# Patient Record
Sex: Female | Born: 1970
Health system: Southern US, Community
[De-identification: ages and names within clinical notes are randomized; demographics above are authoritative.]

## PROBLEM LIST (undated history)

## (undated) DIAGNOSIS — M1990 Unspecified osteoarthritis, unspecified site: Secondary | ICD-10-CM

## (undated) DIAGNOSIS — G801 Spastic diplegic cerebral palsy: Secondary | ICD-10-CM

## (undated) DIAGNOSIS — G809 Cerebral palsy, unspecified: Secondary | ICD-10-CM

## (undated) DIAGNOSIS — T7840XA Allergy, unspecified, initial encounter: Secondary | ICD-10-CM

## (undated) DIAGNOSIS — Z87898 Personal history of other specified conditions: Secondary | ICD-10-CM

## (undated) HISTORY — DX: Cerebral palsy, unspecified: G80.9

## (undated) HISTORY — DX: Spastic diplegic cerebral palsy: G80.1

## (undated) HISTORY — PX: OTHER SURGICAL HISTORY: SHX169

## (undated) HISTORY — PX: CATARACT EXTRACTION: SUR2

## (undated) HISTORY — DX: Allergy, unspecified, initial encounter: T78.40XA

## (undated) HISTORY — DX: Unspecified osteoarthritis, unspecified site: M19.90

## (undated) HISTORY — DX: Personal history of other specified conditions: Z87.898

---

## 1998-05-19 ENCOUNTER — Other Ambulatory Visit: Admission: RE | Admit: 1998-05-19 | Discharge: 1998-05-19 | Payer: Self-pay | Admitting: Obstetrics and Gynecology

## 1999-06-08 ENCOUNTER — Other Ambulatory Visit: Admission: RE | Admit: 1999-06-08 | Discharge: 1999-06-08 | Payer: Self-pay | Admitting: Obstetrics and Gynecology

## 2000-07-31 ENCOUNTER — Other Ambulatory Visit: Admission: RE | Admit: 2000-07-31 | Discharge: 2000-07-31 | Payer: Self-pay | Admitting: Gynecology

## 2001-09-21 ENCOUNTER — Other Ambulatory Visit: Admission: RE | Admit: 2001-09-21 | Discharge: 2001-09-21 | Payer: Self-pay | Admitting: *Deleted

## 2002-04-15 ENCOUNTER — Encounter: Admission: RE | Admit: 2002-04-15 | Discharge: 2002-04-15 | Payer: Self-pay | Admitting: Gynecology

## 2002-11-14 ENCOUNTER — Other Ambulatory Visit: Admission: RE | Admit: 2002-11-14 | Discharge: 2002-11-14 | Payer: Self-pay | Admitting: Gynecology

## 2003-11-17 ENCOUNTER — Other Ambulatory Visit: Admission: RE | Admit: 2003-11-17 | Discharge: 2003-11-17 | Payer: Self-pay | Admitting: Gynecology

## 2004-04-21 ENCOUNTER — Other Ambulatory Visit: Admission: RE | Admit: 2004-04-21 | Discharge: 2004-04-21 | Payer: Self-pay | Admitting: Gynecology

## 2004-08-04 ENCOUNTER — Encounter: Admission: RE | Admit: 2004-08-04 | Discharge: 2004-11-02 | Payer: Self-pay | Admitting: Gynecology

## 2004-12-28 ENCOUNTER — Encounter: Admission: RE | Admit: 2004-12-28 | Discharge: 2005-03-28 | Payer: Self-pay | Admitting: Family Medicine

## 2005-06-29 ENCOUNTER — Other Ambulatory Visit: Admission: RE | Admit: 2005-06-29 | Discharge: 2005-06-29 | Payer: Self-pay | Admitting: Gynecology

## 2006-07-31 ENCOUNTER — Other Ambulatory Visit: Admission: RE | Admit: 2006-07-31 | Discharge: 2006-07-31 | Payer: Self-pay | Admitting: Obstetrics and Gynecology

## 2008-04-14 ENCOUNTER — Encounter: Admission: RE | Admit: 2008-04-14 | Discharge: 2008-07-13 | Payer: Self-pay | Admitting: Family Medicine

## 2008-11-13 ENCOUNTER — Ambulatory Visit: Payer: Self-pay | Admitting: Women's Health

## 2009-03-31 ENCOUNTER — Ambulatory Visit: Payer: Self-pay | Admitting: Women's Health

## 2009-06-26 ENCOUNTER — Ambulatory Visit: Payer: Self-pay | Admitting: Women's Health

## 2009-09-23 ENCOUNTER — Ambulatory Visit: Payer: Self-pay | Admitting: Women's Health

## 2009-12-01 ENCOUNTER — Encounter: Admission: RE | Admit: 2009-12-01 | Discharge: 2009-12-01 | Payer: Self-pay | Admitting: Family Medicine

## 2009-12-03 ENCOUNTER — Encounter: Admission: RE | Admit: 2009-12-03 | Discharge: 2009-12-03 | Payer: Self-pay | Admitting: Family Medicine

## 2009-12-15 ENCOUNTER — Ambulatory Visit: Payer: Self-pay | Admitting: Women's Health

## 2010-03-05 ENCOUNTER — Ambulatory Visit: Payer: Self-pay | Admitting: Women's Health

## 2010-04-24 ENCOUNTER — Encounter: Payer: Self-pay | Admitting: Family Medicine

## 2010-04-24 ENCOUNTER — Encounter: Payer: Self-pay | Admitting: Gynecology

## 2010-05-25 ENCOUNTER — Ambulatory Visit (INDEPENDENT_AMBULATORY_CARE_PROVIDER_SITE_OTHER): Payer: Medicare Other

## 2010-05-25 DIAGNOSIS — Z3049 Encounter for surveillance of other contraceptives: Secondary | ICD-10-CM

## 2010-08-24 ENCOUNTER — Ambulatory Visit (INDEPENDENT_AMBULATORY_CARE_PROVIDER_SITE_OTHER): Payer: Medicare Other

## 2010-08-24 DIAGNOSIS — Z3049 Encounter for surveillance of other contraceptives: Secondary | ICD-10-CM

## 2010-09-01 ENCOUNTER — Ambulatory Visit: Payer: Medicare Other | Attending: Family Medicine | Admitting: Physical Therapy

## 2010-09-01 DIAGNOSIS — IMO0001 Reserved for inherently not codable concepts without codable children: Secondary | ICD-10-CM | POA: Insufficient documentation

## 2010-09-01 DIAGNOSIS — R293 Abnormal posture: Secondary | ICD-10-CM | POA: Insufficient documentation

## 2010-11-11 ENCOUNTER — Encounter: Payer: Self-pay | Admitting: Gynecology

## 2010-11-15 ENCOUNTER — Other Ambulatory Visit: Payer: Medicare Other

## 2010-11-15 ENCOUNTER — Ambulatory Visit (INDEPENDENT_AMBULATORY_CARE_PROVIDER_SITE_OTHER): Payer: Medicare Other | Admitting: Women's Health

## 2010-11-15 ENCOUNTER — Encounter: Payer: Self-pay | Admitting: Women's Health

## 2010-11-15 DIAGNOSIS — R532 Functional quadriplegia: Secondary | ICD-10-CM

## 2010-11-15 DIAGNOSIS — Z8041 Family history of malignant neoplasm of ovary: Secondary | ICD-10-CM

## 2010-11-15 DIAGNOSIS — N912 Amenorrhea, unspecified: Secondary | ICD-10-CM

## 2010-11-15 MED ORDER — MEDROXYPROGESTERONE ACETATE 150 MG/ML IM SUSP
150.0000 mg | Freq: Once | INTRAMUSCULAR | Status: AC
  Administered 2011-03-01: 150 mg via INTRAMUSCULAR

## 2010-11-15 NOTE — Progress Notes (Signed)
ROCHELLA BENNER 02/21/1971 [id number]    History:    The patient presents for annual exam.  Has spastic CP, uses Depo-Provera 150 every 12 weeks to prevent cycles, she is without complaints today. Her parents brought her today, she does live in a group home.  Past medical history, past surgical history, family history and social history were all reviewed and documented in the EPIC chart.   ROS:  A  ROS was performed and pertinent positives and negatives are included in the history.  Exam:  There were no vitals filed for this visit.  General appearance: Contracted muscles/limited mobility Head/Neck:  Normal, without cervical or supraclavicular adenopathy. Thyroid:  Symmetrical, normal in size, without palpable masses or nodularity. Respiratory  Effort:  Normal  Auscultation:  Clear without wheezing or rhonchi Cardiovascular  Auscultation:  Regular rate, without rubs, murmurs or gallops  Edema/varicosities:  Not grossly evident Abdominal   Soft,nontender, without masses, guarding or rebound.  Liver/spleen:  No organomegaly noted  Hernia:  None appreciated  Occult test:   Skin  Inspection:  Grossly normal  Palpation:  Grossly normal Neurologic/psychiatric  Orientation:  Normal with slow conversation.  Mood/affect:  Normal  Genitourinary    Breasts: Examined sitting only, in a wheelchair.     Right: Without masses, retractions, discharge or axillary adenopathy.     Left: Without masses, retractions, discharge or axillary adenopathy,left always larger, no change.   Inguinal/mons:  Normal without inguinal adenopathy  External genitalia:  Normal  BUS/Urethra/Skene's glands:  Normal  Bladder:  Normal  Vagina:  Not done.  Cervix:  Not done  Uterus:  normal in size per [place]  Adnexa/parametria:     Rt: Without masses per [place]   Lt: Without masses per [place]  Anus and perineum:              Digital rectal exam:   Assessment/Plan:  40 y.o. SWF G0 Virgin for annual exam. She has spastic  CP, wheelchair-bound, and can toilet, stand and takes several steps with assistance. She does live in a group home, and has good family support and frequent visits. Her labs are done at her primary care. Reviewed importance of annual flu vaccine and encouraged the Pnuemovac. UA only today. Ultrasound done due to unable to do an adequate pelvic exam. Ultrasound showed a normal uterus and ovaries today. Her last Pap was in 08 which was normal. Did review we will need to do 1 next year, was agreeable, but states it is very uncomfortable and difficult.  She is on Depo-Provera 150 IM every 12 weeks to prevent cycles. She had tolerated this well with no bleeding. Reviewed importance of calcium rich foods, she is on calcium supplement and will continue. She does have much difficulty exercising but states she does do some wheelchair exercises as best that she can. She has had a normal DEXA in the past. Depo-Provera 150 was given in her left deltoid today. Her mother brought the medication to our office. Prescription was given, she does get this through the [facility]. SBEs, encouraged to report changes if noted. She had a normal mammogram in 2011 will schedule 1 this year.   Harrington Challenger [facility], 4:58 PM 11/15/2010

## 2010-12-07 ENCOUNTER — Other Ambulatory Visit: Payer: Self-pay

## 2010-12-07 NOTE — Telephone Encounter (Signed)
PER NANCY REFILLED ABOVE RX'S AND FIBERCON, NAPROXEN SODIUM 220MG . AND BETAMETASONE OINTMENT X 6 MONTHS AND FAXED TO [facility].

## 2010-12-20 ENCOUNTER — Encounter: Payer: Self-pay | Admitting: Women's Health

## 2011-03-01 ENCOUNTER — Ambulatory Visit (INDEPENDENT_AMBULATORY_CARE_PROVIDER_SITE_OTHER): Payer: Medicare Other

## 2011-03-01 DIAGNOSIS — Z23 Encounter for immunization: Secondary | ICD-10-CM

## 2011-03-01 DIAGNOSIS — Z309 Encounter for contraceptive management, unspecified: Secondary | ICD-10-CM

## 2011-04-08 DIAGNOSIS — M8708 Idiopathic aseptic necrosis of bone, other site: Secondary | ICD-10-CM | POA: Diagnosis not present

## 2011-05-27 ENCOUNTER — Ambulatory Visit (INDEPENDENT_AMBULATORY_CARE_PROVIDER_SITE_OTHER): Payer: Medicare Other

## 2011-05-27 DIAGNOSIS — N939 Abnormal uterine and vaginal bleeding, unspecified: Secondary | ICD-10-CM

## 2011-05-27 DIAGNOSIS — N9489 Other specified conditions associated with female genital organs and menstrual cycle: Secondary | ICD-10-CM

## 2011-05-27 DIAGNOSIS — N926 Irregular menstruation, unspecified: Secondary | ICD-10-CM | POA: Diagnosis not present

## 2011-05-27 MED ORDER — MEDROXYPROGESTERONE ACETATE 150 MG/ML IM SUSP
150.0000 mg | Freq: Once | INTRAMUSCULAR | Status: AC
  Administered 2011-05-27: 150 mg via INTRAMUSCULAR

## 2011-06-01 DIAGNOSIS — J069 Acute upper respiratory infection, unspecified: Secondary | ICD-10-CM | POA: Diagnosis not present

## 2011-08-19 ENCOUNTER — Ambulatory Visit (INDEPENDENT_AMBULATORY_CARE_PROVIDER_SITE_OTHER): Payer: Medicare Other

## 2011-08-19 DIAGNOSIS — N939 Abnormal uterine and vaginal bleeding, unspecified: Secondary | ICD-10-CM | POA: Diagnosis not present

## 2011-08-19 DIAGNOSIS — N926 Irregular menstruation, unspecified: Secondary | ICD-10-CM | POA: Diagnosis not present

## 2011-08-19 DIAGNOSIS — N9489 Other specified conditions associated with female genital organs and menstrual cycle: Secondary | ICD-10-CM

## 2011-08-19 MED ORDER — MEDROXYPROGESTERONE ACETATE 150 MG/ML IM SUSP
150.0000 mg | Freq: Once | INTRAMUSCULAR | Status: AC
  Administered 2011-08-19: 150 mg via INTRAMUSCULAR

## 2011-09-09 DIAGNOSIS — M8708 Idiopathic aseptic necrosis of bone, other site: Secondary | ICD-10-CM | POA: Diagnosis not present

## 2011-11-18 ENCOUNTER — Encounter: Payer: Self-pay | Admitting: Women's Health

## 2011-11-18 ENCOUNTER — Ambulatory Visit (INDEPENDENT_AMBULATORY_CARE_PROVIDER_SITE_OTHER): Payer: Medicare Other | Admitting: Women's Health

## 2011-11-18 DIAGNOSIS — G801 Spastic diplegic cerebral palsy: Secondary | ICD-10-CM | POA: Insufficient documentation

## 2011-11-18 DIAGNOSIS — Z8742 Personal history of other diseases of the female genital tract: Secondary | ICD-10-CM

## 2011-11-18 DIAGNOSIS — G809 Cerebral palsy, unspecified: Secondary | ICD-10-CM | POA: Diagnosis not present

## 2011-11-18 DIAGNOSIS — Z3049 Encounter for surveillance of other contraceptives: Secondary | ICD-10-CM

## 2011-11-18 MED ORDER — MEDROXYPROGESTERONE ACETATE 150 MG/ML IM SUSP
150.0000 mg | Freq: Once | INTRAMUSCULAR | Status: AC
  Administered 2011-11-18: 150 mg via INTRAMUSCULAR

## 2011-11-18 MED ORDER — MEDROXYPROGESTERONE ACETATE 150 MG/ML IM SUSP: 150.0000 mg | INTRAMUSCULAR | Status: DC

## 2011-11-18 NOTE — Progress Notes (Signed)
Maureen Roth 03/11/1971 [id number]    History:    The patient presents for refill of Depo-Provera and breast exam. Wheelchair-bound spastic cerebral palsy. History of normal Paps, last Pap 2008, unable to do pap 2012/ spasticity of legs and congenital dislocation of the hips/ virgin.  Normal pelvic ultrasound 2012. Normal mammograms 2012. Mammogram and ultrasound exam is very difficult for patient. Has an annual exam with primary care/ Dr. Kevan Ny. Lives in a group home, weekend home visits. Having some increased problems with arthritis. Speech is slow and deliberate. History of a normal bone density.  Past medical history, past surgical history, family history and social history were all reviewed and documented in the EPIC chart. Denies breast or ovarian cancer history in family.   ROS:  A  ROS was performed and pertinent positives and negatives are included in the history.  Exam:  There were no vitals filed for this visit.  General appearance:  Disabled in wheelchair  Head/Neck:  Normal, without cervical or supraclavicular adenopathy. Thyroid:  Symmetrical, normal in size, without palpable masses or nodularity. Respiratory  Effort:  Normal  Auscultation:  Clear without wheezing or rhonchi Cardiovascular  Auscultation:  Regular rate, without rubs, murmurs or gallops  Edema/varicosities:  Both ankles slightly edematous, cool to touch, pedal and posterior tibial pulses present -patient states always Abdominal  Soft,nontender, without masses, guarding or rebound.  Liver/spleen:  No organomegaly noted  Hernia:  None appreciated  Skin  Inspection:  Grossly normal  Palpation:  Grossly normal Neurologic/psychiatric  Orientation:  Normal with slow response  Mood/affect:  Normal  Genitourinary    Breasts: Examined  sitting.     Right: Without masses, retractions, discharge or axillary adenopathy.     Left: Without masses, retractions, discharge or axillary adenopathy.   Inguinal/mons:   Normal without inguinal adenopathy  Pelvic exam not performed due to limited mobility   Assessment/Plan:  41 y.o.SWF virgin  for refill on Depo-Provera.   Spastic cerebral palsy/congenital hip dislocation/ wheelchair-Depo-Provera  for amenorrhea Primary care - labs   Plan: Discussed limited exam, will do an abdominal pelvic ultrasound every other year since difficult for pt. Reviewed mammogram every other year until 55 and then will try to do yearly. Discussed with patient and home health worker if [name] started with abdominal pain, pelvic pain, changes instructed to call. Depo-Provera 150 IM every 12 weeks, prescription, proper use, given. Reviewed importance of calcium rich diet, vitamin D 2000 daily, will stop calcium supplements, start vitamin D 2000 daily and increase calcium rich foods. Reviewed importance of arm exercises as able, decreasing simple sugars/desserts to help maintain weight and decrease abdominal girth.  Harrington Challenger [facility], 11:02 AM 11/18/2011

## 2011-12-06 DIAGNOSIS — H53039 Strabismic amblyopia, unspecified eye: Secondary | ICD-10-CM | POA: Diagnosis not present

## 2011-12-06 DIAGNOSIS — H5010 Unspecified exotropia: Secondary | ICD-10-CM | POA: Diagnosis not present

## 2011-12-07 DIAGNOSIS — Z23 Encounter for immunization: Secondary | ICD-10-CM | POA: Diagnosis not present

## 2011-12-07 DIAGNOSIS — Z0000 Encounter for general adult medical examination without abnormal findings: Secondary | ICD-10-CM | POA: Diagnosis not present

## 2011-12-07 DIAGNOSIS — Z131 Encounter for screening for diabetes mellitus: Secondary | ICD-10-CM | POA: Diagnosis not present

## 2011-12-13 DIAGNOSIS — Z111 Encounter for screening for respiratory tuberculosis: Secondary | ICD-10-CM | POA: Diagnosis not present

## 2012-02-21 ENCOUNTER — Ambulatory Visit (INDEPENDENT_AMBULATORY_CARE_PROVIDER_SITE_OTHER): Payer: Medicare Other | Admitting: *Deleted

## 2012-02-21 DIAGNOSIS — N9489 Other specified conditions associated with female genital organs and menstrual cycle: Secondary | ICD-10-CM

## 2012-02-21 DIAGNOSIS — N949 Unspecified condition associated with female genital organs and menstrual cycle: Secondary | ICD-10-CM | POA: Diagnosis not present

## 2012-02-21 MED ORDER — MEDROXYPROGESTERONE ACETATE 150 MG/ML IM SUSP
150.0000 mg | Freq: Once | INTRAMUSCULAR | Status: AC
  Administered 2012-02-21: 150 mg via INTRAMUSCULAR

## 2012-03-06 ENCOUNTER — Other Ambulatory Visit: Payer: Self-pay | Admitting: *Deleted

## 2012-03-06 MED ORDER — MOMETASONE FUROATE 50 MCG/ACT NA SUSP: 2.0000 | Freq: Every day | NASAL | Status: DC

## 2012-03-23 DIAGNOSIS — M8708 Idiopathic aseptic necrosis of bone, other site: Secondary | ICD-10-CM | POA: Diagnosis not present

## 2012-04-24 DIAGNOSIS — L089 Local infection of the skin and subcutaneous tissue, unspecified: Secondary | ICD-10-CM | POA: Diagnosis not present

## 2012-05-15 ENCOUNTER — Ambulatory Visit (INDEPENDENT_AMBULATORY_CARE_PROVIDER_SITE_OTHER): Payer: Medicare Other | Admitting: *Deleted

## 2012-05-15 DIAGNOSIS — Z309 Encounter for contraceptive management, unspecified: Secondary | ICD-10-CM | POA: Diagnosis not present

## 2012-05-15 DIAGNOSIS — Z3049 Encounter for surveillance of other contraceptives: Secondary | ICD-10-CM

## 2012-05-15 MED ORDER — MEDROXYPROGESTERONE ACETATE 150 MG/ML IM SUSP
150.0000 mg | Freq: Once | INTRAMUSCULAR | Status: AC
  Administered 2012-05-15: 150 mg via INTRAMUSCULAR

## 2012-08-07 ENCOUNTER — Ambulatory Visit (INDEPENDENT_AMBULATORY_CARE_PROVIDER_SITE_OTHER): Payer: Medicare Other | Admitting: *Deleted

## 2012-08-07 DIAGNOSIS — N949 Unspecified condition associated with female genital organs and menstrual cycle: Secondary | ICD-10-CM | POA: Diagnosis not present

## 2012-08-07 DIAGNOSIS — N9489 Other specified conditions associated with female genital organs and menstrual cycle: Secondary | ICD-10-CM

## 2012-08-07 MED ORDER — MEDROXYPROGESTERONE ACETATE 150 MG/ML IM SUSP
150.0000 mg | Freq: Once | INTRAMUSCULAR | Status: AC
  Administered 2012-08-07: 150 mg via INTRAMUSCULAR

## 2012-09-13 DIAGNOSIS — M8708 Idiopathic aseptic necrosis of bone, other site: Secondary | ICD-10-CM | POA: Diagnosis not present

## 2012-11-23 ENCOUNTER — Ambulatory Visit (INDEPENDENT_AMBULATORY_CARE_PROVIDER_SITE_OTHER): Payer: Medicare Other | Admitting: Women's Health

## 2012-11-23 ENCOUNTER — Encounter: Payer: Self-pay | Admitting: Women's Health

## 2012-11-23 ENCOUNTER — Other Ambulatory Visit: Payer: Self-pay | Admitting: Women's Health

## 2012-11-23 ENCOUNTER — Ambulatory Visit (INDEPENDENT_AMBULATORY_CARE_PROVIDER_SITE_OTHER): Payer: Medicare Other

## 2012-11-23 VITALS — BP 116/66 | Wt 130.0 lb

## 2012-11-23 DIAGNOSIS — N949 Unspecified condition associated with female genital organs and menstrual cycle: Secondary | ICD-10-CM | POA: Diagnosis not present

## 2012-11-23 DIAGNOSIS — R21 Rash and other nonspecific skin eruption: Secondary | ICD-10-CM | POA: Diagnosis not present

## 2012-11-23 DIAGNOSIS — G809 Cerebral palsy, unspecified: Secondary | ICD-10-CM

## 2012-11-23 DIAGNOSIS — N912 Amenorrhea, unspecified: Secondary | ICD-10-CM

## 2012-11-23 DIAGNOSIS — G801 Spastic diplegic cerebral palsy: Secondary | ICD-10-CM

## 2012-11-23 DIAGNOSIS — N9489 Other specified conditions associated with female genital organs and menstrual cycle: Secondary | ICD-10-CM

## 2012-11-23 MED ORDER — MEDROXYPROGESTERONE ACETATE 150 MG/ML IM SUSP: 150.0000 mg | INTRAMUSCULAR | Status: DC

## 2012-11-23 MED ORDER — MEDROXYPROGESTERONE ACETATE 150 MG/ML IM SUSP
150.0000 mg | Freq: Once | INTRAMUSCULAR | Status: AC
  Administered 2012-11-23: 150 mg via INTRAMUSCULAR

## 2012-11-23 MED ORDER — NYSTATIN-TRIAMCINOLONE 100000-0.1 UNIT/GM-% EX OINT: TOPICAL_OINTMENT | Freq: Two times a day (BID) | CUTANEOUS | Status: DC

## 2012-11-23 NOTE — Progress Notes (Signed)
Maureen Roth 1971/03/21 [id number]    History:    The patient presents for breast and pelvic exam.  On Depo-Provera every 3 months for prevention of menses. Spastic CP. Has had one mammogram that was negative, very difficult to have test performed. No breast cancer family history. Uses electtic wheelchair, able to pivot with help, lives in a group home. Family very supportive, mother and case worker present today.   Past medical history, past surgical history, family history and social history were all reviewed and documented in the EPIC chart.   Exam:  Filed Vitals:   11/23/12 1008  BP: 116/66    General appearance:  Cerebral palsy, limited the ability Head/Neck:  Normal, without cervical or supraclavicular adenopathy. Thyroid:  Symmetrical, normal in size, without palpable masses or nodularity. Respiratory  Effort:  Normal  Auscultation:  Clear without wheezing or rhonchi Cardiovascular  Auscultation:  Regular rate, without rubs, murmurs or gallops  Edema/varicosities:  Not grossly evident Abdominal  Soft,nontender, without masses, guarding or rebound.  Liver/spleen:  No organomegaly noted  Hernia:  None appreciated  Skin  Inspection:  Superficial heat rash under her left breast, yeast in appearance Palpation:  Grossly normal Neurologic/psychiatric  Orientation:  Normal with slow speech.    Genitourinary    Breasts: Examined lying and sitting.     Right: Without masses, retractions, discharge or axillary adenopathy.     Left: Without masses, retractions, discharge or axillary adenopathy.   Pelvic ultrasound: normal uterus and ovaries with no abnormalities noted.  Midline and mobile        Assessment/Plan:  [name] Virgin for breast and pelvic exam.  Depo-Provera every 12 weeks for cessation of menses Cerebral palsy- limited mobility   Plan: Depo-Provera 150 every 12 weeks. Reviewed importance of arm exercises as able, calcium rich diet, vitamin D 2000 daily.  Prescription for Mycolog- apply under left breast daily after shower. Reviewed ways is to help prevent rash, instructed to call if symptoms persist..discussed mammogram, will hold off at this time due to difficulty with exam.    Harrington Challenger [facility], 1:28 PM 11/23/2012

## 2012-11-23 NOTE — Addendum Note (Signed)
Addended by: Richardson Chiquito on: 11/23/2012 02:15 PM   Modules accepted: Orders

## 2012-12-12 DIAGNOSIS — Z131 Encounter for screening for diabetes mellitus: Secondary | ICD-10-CM | POA: Diagnosis not present

## 2012-12-12 DIAGNOSIS — Z0000 Encounter for general adult medical examination without abnormal findings: Secondary | ICD-10-CM | POA: Diagnosis not present

## 2013-02-18 ENCOUNTER — Other Ambulatory Visit: Payer: Self-pay

## 2013-02-18 MED ORDER — BETAMETHASONE VALERATE 0.1 % EX CREA: TOPICAL_CREAM | CUTANEOUS | Status: DC

## 2013-02-18 MED ORDER — NAPROXEN SODIUM 220 MG PO CAPS: 1.0000 | ORAL_CAPSULE | Freq: Two times a day (BID) | ORAL | Status: DC

## 2013-02-18 NOTE — Telephone Encounter (Signed)
Maureen Roth, these refills were faxed from pharmacy.  I did not see either one in her med list. The Naproxen was for a lower dose and the sig: q12h. Please review and advise.

## 2013-02-18 NOTE — Telephone Encounter (Signed)
Both are okay  Thanks  Maureen Roth

## 2013-02-22 ENCOUNTER — Ambulatory Visit (INDEPENDENT_AMBULATORY_CARE_PROVIDER_SITE_OTHER): Payer: Medicare Other | Admitting: *Deleted

## 2013-02-22 DIAGNOSIS — N949 Unspecified condition associated with female genital organs and menstrual cycle: Secondary | ICD-10-CM | POA: Diagnosis not present

## 2013-02-22 DIAGNOSIS — N9489 Other specified conditions associated with female genital organs and menstrual cycle: Secondary | ICD-10-CM

## 2013-02-22 DIAGNOSIS — N925 Other specified irregular menstruation: Secondary | ICD-10-CM

## 2013-02-22 MED ORDER — MEDROXYPROGESTERONE ACETATE 150 MG/ML IM SUSP
150.0000 mg | Freq: Once | INTRAMUSCULAR | Status: AC
  Administered 2013-02-22: 150 mg via INTRAMUSCULAR

## 2013-05-31 ENCOUNTER — Ambulatory Visit (INDEPENDENT_AMBULATORY_CARE_PROVIDER_SITE_OTHER): Payer: Medicare Other | Admitting: Gynecology

## 2013-05-31 DIAGNOSIS — Z3049 Encounter for surveillance of other contraceptives: Secondary | ICD-10-CM | POA: Diagnosis not present

## 2013-05-31 MED ORDER — MEDROXYPROGESTERONE ACETATE 150 MG/ML IM SUSP
150.0000 mg | Freq: Once | INTRAMUSCULAR | Status: AC
  Administered 2013-05-31: 150 mg via INTRAMUSCULAR

## 2013-08-22 ENCOUNTER — Other Ambulatory Visit: Payer: Self-pay

## 2013-08-22 MED ORDER — NAPROXEN 500 MG PO TABS: 500.0000 mg | ORAL_TABLET | Freq: Two times a day (BID) | ORAL | Status: DC

## 2013-08-23 ENCOUNTER — Ambulatory Visit (INDEPENDENT_AMBULATORY_CARE_PROVIDER_SITE_OTHER): Payer: Medicare Other | Admitting: *Deleted

## 2013-08-23 DIAGNOSIS — N925 Other specified irregular menstruation: Secondary | ICD-10-CM

## 2013-08-23 DIAGNOSIS — N949 Unspecified condition associated with female genital organs and menstrual cycle: Secondary | ICD-10-CM | POA: Diagnosis not present

## 2013-08-23 DIAGNOSIS — N9489 Other specified conditions associated with female genital organs and menstrual cycle: Secondary | ICD-10-CM

## 2013-08-23 MED ORDER — MEDROXYPROGESTERONE ACETATE 150 MG/ML IM SUSP
150.0000 mg | Freq: Once | INTRAMUSCULAR | Status: AC
  Administered 2013-08-23: 150 mg via INTRAMUSCULAR

## 2013-10-01 ENCOUNTER — Telehealth: Payer: Self-pay | Admitting: *Deleted

## 2013-10-01 NOTE — Telephone Encounter (Signed)
Tresa EndoKelly the supervisor at [employer] called stating that patient is taking Naproxen 375 mg prescribed by her arthritis MD. She noticed that on 08/22/13 we refilled Rx for Naproxen 500 mg which was requested from pharmacy. Tresa EndoKelly asked if you could write on Rx pad that okay to d/c naproxen 500 mg so I can fax to her.  Please advise

## 2013-10-01 NOTE — Telephone Encounter (Signed)
Rx faxed

## 2013-10-01 NOTE — Telephone Encounter (Signed)
Yes, will do

## 2013-11-04 ENCOUNTER — Other Ambulatory Visit: Payer: Self-pay | Admitting: Women's Health

## 2013-11-04 DIAGNOSIS — Z8041 Family history of malignant neoplasm of ovary: Secondary | ICD-10-CM

## 2013-11-04 DIAGNOSIS — G809 Cerebral palsy, unspecified: Secondary | ICD-10-CM

## 2013-11-04 DIAGNOSIS — Z3042 Encounter for surveillance of injectable contraceptive: Secondary | ICD-10-CM

## 2013-11-13 DIAGNOSIS — M8708 Idiopathic aseptic necrosis of bone, other site: Secondary | ICD-10-CM | POA: Diagnosis not present

## 2013-11-27 ENCOUNTER — Other Ambulatory Visit: Payer: Medicare Other

## 2013-11-27 ENCOUNTER — Ambulatory Visit: Payer: Medicare Other | Admitting: Women's Health

## 2013-11-27 ENCOUNTER — Ambulatory Visit (INDEPENDENT_AMBULATORY_CARE_PROVIDER_SITE_OTHER): Payer: Medicare Other

## 2013-11-27 ENCOUNTER — Encounter: Payer: Self-pay | Admitting: Women's Health

## 2013-11-27 ENCOUNTER — Ambulatory Visit (INDEPENDENT_AMBULATORY_CARE_PROVIDER_SITE_OTHER): Payer: Medicare Other | Admitting: Women's Health

## 2013-11-27 VITALS — BP 136/82

## 2013-11-27 DIAGNOSIS — Z3042 Encounter for surveillance of injectable contraceptive: Secondary | ICD-10-CM

## 2013-11-27 DIAGNOSIS — N938 Other specified abnormal uterine and vaginal bleeding: Secondary | ICD-10-CM | POA: Diagnosis not present

## 2013-11-27 DIAGNOSIS — J309 Allergic rhinitis, unspecified: Secondary | ICD-10-CM

## 2013-11-27 DIAGNOSIS — N949 Unspecified condition associated with female genital organs and menstrual cycle: Secondary | ICD-10-CM

## 2013-11-27 DIAGNOSIS — R21 Rash and other nonspecific skin eruption: Secondary | ICD-10-CM | POA: Diagnosis not present

## 2013-11-27 DIAGNOSIS — J302 Other seasonal allergic rhinitis: Secondary | ICD-10-CM

## 2013-11-27 DIAGNOSIS — N9489 Other specified conditions associated with female genital organs and menstrual cycle: Secondary | ICD-10-CM

## 2013-11-27 DIAGNOSIS — N925 Other specified irregular menstruation: Secondary | ICD-10-CM

## 2013-11-27 DIAGNOSIS — Z3049 Encounter for surveillance of other contraceptives: Secondary | ICD-10-CM | POA: Diagnosis not present

## 2013-11-27 DIAGNOSIS — G801 Spastic diplegic cerebral palsy: Secondary | ICD-10-CM

## 2013-11-27 DIAGNOSIS — G809 Cerebral palsy, unspecified: Secondary | ICD-10-CM

## 2013-11-27 DIAGNOSIS — Z8041 Family history of malignant neoplasm of ovary: Secondary | ICD-10-CM

## 2013-11-27 HISTORY — DX: Other seasonal allergic rhinitis: J30.2

## 2013-11-27 MED ORDER — MEDROXYPROGESTERONE ACETATE 150 MG/ML IM SUSP
150.0000 mg | Freq: Once | INTRAMUSCULAR | Status: AC
  Administered 2013-11-27: 150 mg via INTRAMUSCULAR

## 2013-11-27 MED ORDER — MEDROXYPROGESTERONE ACETATE 150 MG/ML IM SUSP: 150.0000 mg | INTRAMUSCULAR | Status: DC

## 2013-11-27 MED ORDER — NYSTATIN-TRIAMCINOLONE 100000-0.1 UNIT/GM-% EX OINT: TOPICAL_OINTMENT | Freq: Two times a day (BID) | CUTANEOUS | Status: DC

## 2013-11-27 NOTE — Progress Notes (Signed)
Maureen Roth 04-10-1970 [id number]    History:    Presents for breast exam and ultrasound. Spastic CP, congenital hip dislocation, uses a motorized wheelchair and lives in a group home. Speech slow and deliberate. Depo-Provera every 12 weeks/amenorrheic for greater than 20 years. Has a brace for her wrists.  Parents and healthcare worker accompanied. Virgin. Normal mammogram age 44, difficult to tolerate. Uses naproxen for pain relief. Pap normal 2008.  Past medical history, past surgical history, family history and social history were all reviewed and documented in the EPIC chart. 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  ROS:  A  12 point ROS was performed and pertinent positives and negatives are included.  Exam:  Filed Vitals:   11/27/13 1640  BP: 136/82    General appearance:  Cerebral palsy/wheelchair-bound, clean, skin in great condition Thyroid:  Symmetrical, normal in size, without palpable masses or nodularity. Respiratory  Auscultation:  Clear without wheezing or rhonchi Cardiovascular  Auscultation:  Regular rate, without rubs, murmurs or gallops  Edema/varicosities:  Not grossly evident Abdominal  Soft,nontender, without masses, guarding or rebound.  Liver/spleen:  No organomegaly noted  Hernia:  None appreciated  Skin  Inspection:  Grossly normal   Breasts: Examined lying and sitting.     Right: Without masses, retractions, discharge or axillary adenopathy.     Left: Without masses, retractions, discharge or axillary adenopathy. Gentitourinary   Inguinal/mons:  Normal without inguinal adenopathy  External genitalia:  Transabdominal ultrasound. Anteverted uterus with homogeneous echo pattern. Thin echogenic endometrial cavity. Endometrium 3.1 mm. Right and left ovary normal. Negative cul-de-sac.  Assessment/Plan:  43 y.o. SWF virgin for breast exam and ultrasound.  Amenorrheic on Depo-Provera Spastic cerebral palsy/motorized wheelchair  Plan:  Continue labs at primary care. Depo-Provera 150 IM every 12 weeks, prescription, proper use reviewed. Instructed to call if any bleeding. Mammogram at age 69. Prescription for Mycolog to use as needed in the groin, uses rarely but has occasional irritation. Medications reviewed with Rosalita Chessman and and mother. Continue vitamin D.   Note: This dictation was prepared with Dragon/digital dictation.  Any transcriptional errors that result are unintentional. Harrington Challenger [facility], 4:45 PM 11/27/2013

## 2013-12-10 DIAGNOSIS — H16219 Exposure keratoconjunctivitis, unspecified eye: Secondary | ICD-10-CM | POA: Diagnosis not present

## 2013-12-10 DIAGNOSIS — H5010 Unspecified exotropia: Secondary | ICD-10-CM | POA: Diagnosis not present

## 2013-12-19 DIAGNOSIS — Z1331 Encounter for screening for depression: Secondary | ICD-10-CM | POA: Diagnosis not present

## 2013-12-19 DIAGNOSIS — H6120 Impacted cerumen, unspecified ear: Secondary | ICD-10-CM | POA: Diagnosis not present

## 2013-12-19 DIAGNOSIS — Z131 Encounter for screening for diabetes mellitus: Secondary | ICD-10-CM | POA: Diagnosis not present

## 2013-12-19 DIAGNOSIS — Z136 Encounter for screening for cardiovascular disorders: Secondary | ICD-10-CM | POA: Diagnosis not present

## 2013-12-19 DIAGNOSIS — Z23 Encounter for immunization: Secondary | ICD-10-CM | POA: Diagnosis not present

## 2013-12-19 DIAGNOSIS — Z0000 Encounter for general adult medical examination without abnormal findings: Secondary | ICD-10-CM | POA: Diagnosis not present

## 2013-12-20 DIAGNOSIS — Z111 Encounter for screening for respiratory tuberculosis: Secondary | ICD-10-CM | POA: Diagnosis not present

## 2013-12-30 ENCOUNTER — Telehealth: Payer: Self-pay

## 2013-12-30 NOTE — Telephone Encounter (Signed)
Pharmacy faxed a note stating "Patient has an existing Rx for NASONEX nasal spray.  This medication is on LONG TERM manufacturer back order with no release date.  Please consider order change to NASACORT or FLONASE spray to avoid delay in therapy.".

## 2013-12-30 NOTE — Telephone Encounter (Signed)
Flonase 1 spray per nostril bid prn with 12 refills.  I think she uses mostly seasonal.  Lives at a group home, W/C bound. If you have time call her mom to see if she has used either before.

## 2013-12-31 ENCOUNTER — Other Ambulatory Visit: Payer: Self-pay | Admitting: Women's Health

## 2013-12-31 MED ORDER — FLUTICASONE PROPIONATE 50 MCG/ACT NA SUSP: 1.0000 | Freq: Every day | NASAL | Status: DC

## 2013-12-31 NOTE — Telephone Encounter (Signed)
Rx sent to pharmacy   

## 2014-02-21 ENCOUNTER — Ambulatory Visit (INDEPENDENT_AMBULATORY_CARE_PROVIDER_SITE_OTHER): Payer: Medicare Other | Admitting: *Deleted

## 2014-02-21 DIAGNOSIS — Z3042 Encounter for surveillance of injectable contraceptive: Secondary | ICD-10-CM

## 2014-02-21 MED ORDER — MEDROXYPROGESTERONE ACETATE 150 MG/ML IM SUSP
150.0000 mg | Freq: Once | INTRAMUSCULAR | Status: AC
  Administered 2014-02-21: 150 mg via INTRAMUSCULAR

## 2014-03-07 DIAGNOSIS — M8708 Idiopathic aseptic necrosis of bone, other site: Secondary | ICD-10-CM | POA: Diagnosis not present

## 2014-04-15 DIAGNOSIS — H6122 Impacted cerumen, left ear: Secondary | ICD-10-CM | POA: Diagnosis not present

## 2014-04-15 DIAGNOSIS — S2001XA Contusion of right breast, initial encounter: Secondary | ICD-10-CM | POA: Diagnosis not present

## 2014-05-30 ENCOUNTER — Other Ambulatory Visit (INDEPENDENT_AMBULATORY_CARE_PROVIDER_SITE_OTHER): Payer: Medicare Other | Admitting: Anesthesiology

## 2014-05-30 DIAGNOSIS — Z3042 Encounter for surveillance of injectable contraceptive: Secondary | ICD-10-CM

## 2014-05-30 MED ORDER — MEDROXYPROGESTERONE ACETATE 150 MG/ML IM SUSP
150.0000 mg | Freq: Once | INTRAMUSCULAR | Status: AC
  Administered 2014-05-30: 150 mg via INTRAMUSCULAR

## 2014-08-26 ENCOUNTER — Ambulatory Visit (INDEPENDENT_AMBULATORY_CARE_PROVIDER_SITE_OTHER): Payer: Medicare Other | Admitting: *Deleted

## 2014-08-26 DIAGNOSIS — Z3049 Encounter for surveillance of other contraceptives: Secondary | ICD-10-CM | POA: Diagnosis not present

## 2014-08-26 DIAGNOSIS — N9489 Other specified conditions associated with female genital organs and menstrual cycle: Secondary | ICD-10-CM

## 2014-08-26 MED ORDER — MEDROXYPROGESTERONE ACETATE 150 MG/ML IM SUSP
150.0000 mg | Freq: Once | INTRAMUSCULAR | Status: AC
  Administered 2014-08-26: 150 mg via INTRAMUSCULAR

## 2014-10-17 ENCOUNTER — Other Ambulatory Visit: Payer: Self-pay | Admitting: Women's Health

## 2014-10-17 DIAGNOSIS — N942 Vaginismus: Secondary | ICD-10-CM

## 2014-11-14 DIAGNOSIS — M8708 Idiopathic aseptic necrosis of bone, other site: Secondary | ICD-10-CM | POA: Diagnosis not present

## 2014-12-03 ENCOUNTER — Encounter: Payer: Self-pay | Admitting: Women's Health

## 2014-12-03 ENCOUNTER — Other Ambulatory Visit: Payer: Self-pay | Admitting: Women's Health

## 2014-12-03 ENCOUNTER — Ambulatory Visit (INDEPENDENT_AMBULATORY_CARE_PROVIDER_SITE_OTHER): Payer: Medicare Other | Admitting: Women's Health

## 2014-12-03 ENCOUNTER — Ambulatory Visit (INDEPENDENT_AMBULATORY_CARE_PROVIDER_SITE_OTHER): Payer: Medicare Other

## 2014-12-03 VITALS — BP 124/80

## 2014-12-03 DIAGNOSIS — N942 Vaginismus: Secondary | ICD-10-CM | POA: Diagnosis not present

## 2014-12-03 DIAGNOSIS — R21 Rash and other nonspecific skin eruption: Secondary | ICD-10-CM | POA: Diagnosis not present

## 2014-12-03 DIAGNOSIS — G801 Spastic diplegic cerebral palsy: Secondary | ICD-10-CM

## 2014-12-03 DIAGNOSIS — G809 Cerebral palsy, unspecified: Secondary | ICD-10-CM | POA: Diagnosis not present

## 2014-12-03 DIAGNOSIS — N8300 Follicular cyst of ovary, unspecified side: Secondary | ICD-10-CM

## 2014-12-03 DIAGNOSIS — R350 Frequency of micturition: Secondary | ICD-10-CM | POA: Diagnosis not present

## 2014-12-03 DIAGNOSIS — Z3042 Encounter for surveillance of injectable contraceptive: Secondary | ICD-10-CM | POA: Diagnosis not present

## 2014-12-03 DIAGNOSIS — L299 Pruritus, unspecified: Secondary | ICD-10-CM | POA: Diagnosis not present

## 2014-12-03 LAB — URINALYSIS W MICROSCOPIC + REFLEX CULTURE
Bilirubin Urine: NEGATIVE
CRYSTALS: NONE SEEN [HPF]
Casts: NONE SEEN [LPF]
GLUCOSE, UA: NEGATIVE
KETONES UR: NEGATIVE
Nitrite: NEGATIVE
PROTEIN: NEGATIVE
Specific Gravity, Urine: 1.01 (ref 1.001–1.035)
YEAST: NONE SEEN [HPF]
pH: 7 (ref 5.0–8.0)

## 2014-12-03 MED ORDER — BETAMETHASONE VALERATE 0.1 % EX CREA: TOPICAL_CREAM | CUTANEOUS | Status: DC

## 2014-12-03 MED ORDER — MEDROXYPROGESTERONE ACETATE 150 MG/ML IM SUSP
150.0000 mg | Freq: Once | INTRAMUSCULAR | Status: AC
  Administered 2014-12-03: 150 mg via INTRAMUSCULAR

## 2014-12-03 MED ORDER — MEDROXYPROGESTERONE ACETATE 150 MG/ML IM SUSP: 150.0000 mg | INTRAMUSCULAR | Status: DC

## 2014-12-03 MED ORDER — NYSTATIN-TRIAMCINOLONE 100000-0.1 UNIT/GM-% EX OINT: TOPICAL_OINTMENT | Freq: Two times a day (BID) | CUTANEOUS | Status: DC

## 2014-12-03 NOTE — Progress Notes (Signed)
.   Maureen Roth Mar 20, 1971 [id number]    History:    Presents for breast and pelvic exam.  Spastic cerebral palsy, wheelchair bound. Able to pivot, take several steps with maximum assistant.  Lives in a group home. Complaining of red rash to the left axilla and underneath her breasts. Denies itching. Virgin. Depo-Provera Provera q 12 weeks/amenorrhea. Normal Pap and mammogram, last mammogram 2011. Receives influenza shot.  Past medical history, past surgical history, family history and social history were all reviewed and documented in the EPIC chart. Parents supportive, visit often, present today.  ROS:  A ROS was performed and pertinent positives and negatives are included.  Exam:  Filed Vitals:   12/03/14 1541  BP: 124/80    General appearance:  Wheelchair bound, left arm contracted  Thyroid:  Symmetrical, normal in size, without palpable masses or nodularity. Respiratory  Auscultation:  Clear without wheezing or rhonchi Cardiovascular  Auscultation:  Regular rate, without rubs, murmurs or gallops  Edema/varicosities:  Not grossly evident Abdominal  Soft,nontender, without masses, guarding or rebound.  Liver/spleen:  No organomegaly noted  Hernia:  None appreciated  Skin some erythema noted to left axilla, under bilateral breasts  Inspection:  Grossly normal   Breasts: Examined lying and sitting.     Right: Without masses, retractions, discharge or axillary adenopathy.     Left: Without masses, retractions, discharge or axillary adenopathy. Gentitourinary   Inguinal/mons:  Normal without inguinal adenopathy  Uterus:  Ultrasound: T/A anteverted uterus homogeneous echo pattern. Right ovary follicle 9 x 6 mm. Left ovary follicle 8 mm. Negative CDS. No apparent mass seen in right or left adnexal  Unable to perform pelvic exam or Pap due to mobility issues.    Assessment/Plan: 44 yo SWF  for breast and pelvic exam   Amenorrhea on Depo-Provera Cerebral palsy  wheelchair-bound/ slow speech Rash erythematous papular rash to left axilla and under left breast   Plan: Reviewed normal ultrasound.   Recommended mammogram and influenza shot. Encouraged healthy diet and continue doing arm exercises. Given betamethasone valerate (VALISONE) 0.1 % cream for axilla rash and nystatin-triamcinolone ointment (MYCOLOG) for rash below breasts as needed. Urinalysis showed few bacteria. Will culture . Follow up in 3 months for Depo-Provera.      Harrington Challenger [facility], 4:07 PM 12/03/2014

## 2014-12-03 NOTE — Addendum Note (Signed)
Addended by: Harrington Challenger on: 12/03/2014 05:06 PM   Modules accepted: Orders

## 2014-12-05 LAB — URINE CULTURE: Colony Count: 50000

## 2014-12-25 DIAGNOSIS — Z0000 Encounter for general adult medical examination without abnormal findings: Secondary | ICD-10-CM | POA: Diagnosis not present

## 2014-12-25 DIAGNOSIS — Z23 Encounter for immunization: Secondary | ICD-10-CM | POA: Diagnosis not present

## 2014-12-25 DIAGNOSIS — Z136 Encounter for screening for cardiovascular disorders: Secondary | ICD-10-CM | POA: Diagnosis not present

## 2014-12-25 DIAGNOSIS — Z131 Encounter for screening for diabetes mellitus: Secondary | ICD-10-CM | POA: Diagnosis not present

## 2015-05-11 ENCOUNTER — Telehealth: Payer: Self-pay | Admitting: *Deleted

## 2015-05-11 NOTE — Telephone Encounter (Signed)
Pt mother called Patsy stating the group home called her c/o pt spotting started yesterday pm. Today spotting only as well, pt received last depo-provera injection on 12/03/14. I explained to mother spotting could come from late depo-provera injection, I told them to call and schedule lab appointment, mother would like U/A as well if possible. Please advise

## 2015-05-11 NOTE — Telephone Encounter (Signed)
Maureen Roth has severe handicap, unable to walk, can pivot, but having a cycle very difficult, that is why on depo, why has she not received?  Best to start back on.

## 2015-05-11 NOTE — Telephone Encounter (Signed)
Unsure why patient never come back for depo provera , I will call mother and tell her to schedule lab appointment for injection. Pt coming tomorrow on 9:00am

## 2015-05-12 ENCOUNTER — Ambulatory Visit (INDEPENDENT_AMBULATORY_CARE_PROVIDER_SITE_OTHER): Payer: Medicare Other | Admitting: *Deleted

## 2015-05-12 DIAGNOSIS — N9489 Other specified conditions associated with female genital organs and menstrual cycle: Secondary | ICD-10-CM | POA: Diagnosis not present

## 2015-05-12 DIAGNOSIS — Z3042 Encounter for surveillance of injectable contraceptive: Secondary | ICD-10-CM

## 2015-05-12 MED ORDER — MEDROXYPROGESTERONE ACETATE 150 MG/ML IM SUSP
150.0000 mg | Freq: Once | INTRAMUSCULAR | Status: AC
  Administered 2015-05-12: 150 mg via INTRAMUSCULAR

## 2015-05-12 NOTE — Telephone Encounter (Signed)
Ok per Maryelizabeth Rowan to give injection without UPT, pt no sexual active

## 2015-06-30 ENCOUNTER — Other Ambulatory Visit: Payer: Self-pay

## 2015-06-30 MED ORDER — FLUTICASONE PROPIONATE 50 MCG/ACT NA SUSP: 1.0000 | Freq: Every day | NASAL | Status: DC

## 2015-08-11 ENCOUNTER — Other Ambulatory Visit: Payer: Self-pay | Admitting: Women's Health

## 2015-08-13 ENCOUNTER — Ambulatory Visit (INDEPENDENT_AMBULATORY_CARE_PROVIDER_SITE_OTHER): Payer: Medicare Other | Admitting: *Deleted

## 2015-08-13 DIAGNOSIS — Z3042 Encounter for surveillance of injectable contraceptive: Secondary | ICD-10-CM | POA: Diagnosis not present

## 2015-08-13 MED ORDER — MEDROXYPROGESTERONE ACETATE 150 MG/ML IM SUSP
150.0000 mg | Freq: Once | INTRAMUSCULAR | Status: AC
  Administered 2015-08-13: 150 mg via INTRAMUSCULAR

## 2015-11-06 ENCOUNTER — Ambulatory Visit (INDEPENDENT_AMBULATORY_CARE_PROVIDER_SITE_OTHER): Payer: Medicare Other | Admitting: *Deleted

## 2015-11-06 DIAGNOSIS — Z3042 Encounter for surveillance of injectable contraceptive: Secondary | ICD-10-CM

## 2015-11-06 MED ORDER — MEDROXYPROGESTERONE ACETATE 150 MG/ML IM SUSP
150.0000 mg | Freq: Once | INTRAMUSCULAR | Status: AC
  Administered 2015-11-06: 150 mg via INTRAMUSCULAR

## 2015-11-17 ENCOUNTER — Other Ambulatory Visit: Payer: Self-pay | Admitting: Women's Health

## 2015-11-19 DIAGNOSIS — G809 Cerebral palsy, unspecified: Secondary | ICD-10-CM | POA: Diagnosis not present

## 2015-12-02 ENCOUNTER — Other Ambulatory Visit: Payer: Self-pay | Admitting: Women's Health

## 2015-12-02 DIAGNOSIS — Z3042 Encounter for surveillance of injectable contraceptive: Secondary | ICD-10-CM

## 2015-12-04 ENCOUNTER — Other Ambulatory Visit: Payer: Medicare Other

## 2015-12-04 ENCOUNTER — Ambulatory Visit: Payer: Medicare Other | Admitting: Women's Health

## 2015-12-08 DIAGNOSIS — H2513 Age-related nuclear cataract, bilateral: Secondary | ICD-10-CM | POA: Diagnosis not present

## 2015-12-08 DIAGNOSIS — H04123 Dry eye syndrome of bilateral lacrimal glands: Secondary | ICD-10-CM | POA: Diagnosis not present

## 2015-12-16 ENCOUNTER — Ambulatory Visit (INDEPENDENT_AMBULATORY_CARE_PROVIDER_SITE_OTHER): Payer: Medicare Other

## 2015-12-16 ENCOUNTER — Encounter: Payer: Self-pay | Admitting: Women's Health

## 2015-12-16 ENCOUNTER — Ambulatory Visit (INDEPENDENT_AMBULATORY_CARE_PROVIDER_SITE_OTHER): Payer: Medicare Other | Admitting: Women's Health

## 2015-12-16 ENCOUNTER — Ambulatory Visit: Payer: Medicare Other | Admitting: Women's Health

## 2015-12-16 ENCOUNTER — Ambulatory Visit: Payer: Self-pay

## 2015-12-16 DIAGNOSIS — Z793 Long term (current) use of hormonal contraceptives: Secondary | ICD-10-CM

## 2015-12-16 DIAGNOSIS — Z01419 Encounter for gynecological examination (general) (routine) without abnormal findings: Secondary | ICD-10-CM

## 2015-12-16 DIAGNOSIS — G809 Cerebral palsy, unspecified: Secondary | ICD-10-CM | POA: Diagnosis not present

## 2015-12-16 DIAGNOSIS — Z3042 Encounter for surveillance of injectable contraceptive: Secondary | ICD-10-CM | POA: Diagnosis not present

## 2015-12-16 DIAGNOSIS — R21 Rash and other nonspecific skin eruption: Secondary | ICD-10-CM

## 2015-12-16 DIAGNOSIS — L299 Pruritus, unspecified: Secondary | ICD-10-CM | POA: Diagnosis not present

## 2015-12-16 DIAGNOSIS — R350 Frequency of micturition: Secondary | ICD-10-CM

## 2015-12-16 DIAGNOSIS — Z8041 Family history of malignant neoplasm of ovary: Secondary | ICD-10-CM | POA: Diagnosis not present

## 2015-12-16 DIAGNOSIS — Z3049 Encounter for surveillance of other contraceptives: Secondary | ICD-10-CM | POA: Diagnosis not present

## 2015-12-16 MED ORDER — BETAMETHASONE VALERATE 0.1 % EX CREA: TOPICAL_CREAM | CUTANEOUS | 1 refills | Status: DC

## 2015-12-16 MED ORDER — NYSTATIN-TRIAMCINOLONE 100000-0.1 UNIT/GM-% EX OINT: TOPICAL_OINTMENT | Freq: Two times a day (BID) | CUTANEOUS | 1 refills | Status: DC

## 2015-12-16 NOTE — Patient Instructions (Signed)
Health Maintenance, Female Adopting a healthy lifestyle and getting preventive care can go a long way to promote health and wellness. Talk with your health care provider about what schedule of regular examinations is right for you. This is a good chance for you to check in with your provider about disease prevention and staying healthy. In between checkups, there are plenty of things you can do on your own. Experts have done a lot of research about which lifestyle changes and preventive measures are most likely to keep you healthy. Ask your health care provider for more information. WEIGHT AND DIET  Eat a healthy diet  Be sure to include plenty of vegetables, fruits, low-fat dairy products, and lean protein.  Do not eat a lot of foods high in solid fats, added sugars, or salt.  Get regular exercise. This is one of the most important things you can do for your health.  Most adults should exercise for at least 150 minutes each week. The exercise should increase your heart rate and make you sweat (moderate-intensity exercise).  Most adults should also do strengthening exercises at least twice a week. This is in addition to the moderate-intensity exercise.  Maintain a healthy weight  Body mass index (BMI) is a measurement that can be used to identify possible weight problems. It estimates body fat based on height and weight. Your health care provider can help determine your BMI and help you achieve or maintain a healthy weight.  For females 20 years of age and older:   A BMI below 18.5 is considered underweight.  A BMI of 18.5 to 24.9 is normal.  A BMI of 25 to 29.9 is considered overweight.  A BMI of 30 and above is considered obese.  Watch levels of cholesterol and blood lipids  You should start having your blood tested for lipids and cholesterol at 46 years of age, then have this test every 5 years.  You may need to have your cholesterol levels checked more often if:  Your lipid  or cholesterol levels are high.  You are older than 46 years of age.  You are at high risk for heart disease.  CANCER SCREENING   Lung Cancer  Lung cancer screening is recommended for adults 55-80 years old who are at high risk for lung cancer because of a history of smoking.  A yearly low-dose CT scan of the lungs is recommended for people who:  Currently smoke.  Have quit within the past 15 years.  Have at least a 30-pack-year history of smoking. A pack year is smoking an average of one pack of cigarettes a day for 1 year.  Yearly screening should continue until it has been 15 years since you quit.  Yearly screening should stop if you develop a health problem that would prevent you from having lung cancer treatment.  Breast Cancer  Practice breast self-awareness. This means understanding how your breasts normally appear and feel.  It also means doing regular breast self-exams. Let your health care provider know about any changes, no matter how small.  If you are in your 20s or 30s, you should have a clinical breast exam (CBE) by a health care provider every 1-3 years as part of a regular health exam.  If you are 40 or older, have a CBE every year. Also consider having a breast X-ray (mammogram) every year.  If you have a family history of breast cancer, talk to your health care provider about genetic screening.  If you   are at high risk for breast cancer, talk to your health care provider about having an MRI and a mammogram every year.  Breast cancer gene (BRCA) assessment is recommended for women who have family members with BRCA-related cancers. BRCA-related cancers include:  Breast.  Ovarian.  Tubal.  Peritoneal cancers.  Results of the assessment will determine the need for genetic counseling and BRCA1 and BRCA2 testing. Cervical Cancer Your health care provider may recommend that you be screened regularly for cancer of the pelvic organs (ovaries, uterus, and  vagina). This screening involves a pelvic examination, including checking for microscopic changes to the surface of your cervix (Pap test). You may be encouraged to have this screening done every 3 years, beginning at age 21.  For women ages 30-65, health care providers may recommend pelvic exams and Pap testing every 3 years, or they may recommend the Pap and pelvic exam, combined with testing for human papilloma virus (HPV), every 5 years. Some types of HPV increase your risk of cervical cancer. Testing for HPV may also be done on women of any age with unclear Pap test results.  Other health care providers may not recommend any screening for nonpregnant women who are considered low risk for pelvic cancer and who do not have symptoms. Ask your health care provider if a screening pelvic exam is right for you.  If you have had past treatment for cervical cancer or a condition that could lead to cancer, you need Pap tests and screening for cancer for at least 20 years after your treatment. If Pap tests have been discontinued, your risk factors (such as having a new sexual partner) need to be reassessed to determine if screening should resume. Some women have medical problems that increase the chance of getting cervical cancer. In these cases, your health care provider may recommend more frequent screening and Pap tests. Colorectal Cancer  This type of cancer can be detected and often prevented.  Routine colorectal cancer screening usually begins at 46 years of age and continues through 46 years of age.  Your health care provider may recommend screening at an earlier age if you have risk factors for colon cancer.  Your health care provider may also recommend using home test kits to check for hidden blood in the stool.  A small camera at the end of a tube can be used to examine your colon directly (sigmoidoscopy or colonoscopy). This is done to check for the earliest forms of colorectal  cancer.  Routine screening usually begins at age 50.  Direct examination of the colon should be repeated every 5-10 years through 46 years of age. However, you may need to be screened more often if early forms of precancerous polyps or small growths are found. Skin Cancer  Check your skin from head to toe regularly.  Tell your health care provider about any new moles or changes in moles, especially if there is a change in a mole's shape or color.  Also tell your health care provider if you have a mole that is larger than the size of a pencil eraser.  Always use sunscreen. Apply sunscreen liberally and repeatedly throughout the day.  Protect yourself by wearing long sleeves, pants, a wide-brimmed hat, and sunglasses whenever you are outside. HEART DISEASE, DIABETES, AND HIGH BLOOD PRESSURE   High blood pressure causes heart disease and increases the risk of stroke. High blood pressure is more likely to develop in:  People who have blood pressure in the high end   of the normal range (130-139/85-89 mm Hg).  People who are overweight or obese.  People who are African American.  If you are 38-23 years of age, have your blood pressure checked every 3-5 years. If you are 61 years of age or older, have your blood pressure checked every year. You should have your blood pressure measured twice--once when you are at a hospital or clinic, and once when you are not at a hospital or clinic. Record the average of the two measurements. To check your blood pressure when you are not at a hospital or clinic, you can use:  An automated blood pressure machine at a pharmacy.  A home blood pressure monitor.  If you are between 45 years and 39 years old, ask your health care provider if you should take aspirin to prevent strokes.  Have regular diabetes screenings. This involves taking a blood sample to check your fasting blood sugar level.  If you are at a normal weight and have a low risk for diabetes,  have this test once every three years after 46 years of age.  If you are overweight and have a high risk for diabetes, consider being tested at a younger age or more often. PREVENTING INFECTION  Hepatitis B  If you have a higher risk for hepatitis B, you should be screened for this virus. You are considered at high risk for hepatitis B if:  You were born in a country where hepatitis B is common. Ask your health care provider which countries are considered high risk.  Your parents were born in a high-risk country, and you have not been immunized against hepatitis B (hepatitis B vaccine).  You have HIV or AIDS.  You use needles to inject street drugs.  You live with someone who has hepatitis B.  You have had sex with someone who has hepatitis B.  You get hemodialysis treatment.  You take certain medicines for conditions, including cancer, organ transplantation, and autoimmune conditions. Hepatitis C  Blood testing is recommended for:  Everyone born from [birth year] through 1966.  Anyone with known risk factors for hepatitis C. Sexually transmitted infections (STIs)  You should be screened for sexually transmitted infections (STIs) including gonorrhea and chlamydia if:  You are sexually active and are younger than 46 years of age.  You are older than 46 years of age and your health care provider tells you that you are at risk for this type of infection.  Your sexual activity has changed since you were last screened and you are at an increased risk for chlamydia or gonorrhea. Ask your health care provider if you are at risk.  If you do not have HIV, but are at risk, it may be recommended that you take a prescription medicine daily to prevent HIV infection. This is called pre-exposure prophylaxis (PrEP). You are considered at risk if:  You are sexually active and do not regularly use condoms or know the HIV status of your partner(s).  You take drugs by injection.  You are sexually  active with a partner who has HIV. Talk with your health care provider about whether you are at high risk of being infected with HIV. If you choose to begin PrEP, you should first be tested for HIV. You should then be tested every 3 months for as long as you are taking PrEP.  PREGNANCY   If you are premenopausal and you may become pregnant, ask your health care provider about preconception counseling.  If you may  become pregnant, take 400 to 800 micrograms (mcg) of folic acid every day.  If you want to prevent pregnancy, talk to your health care provider about birth control (contraception). OSTEOPOROSIS AND MENOPAUSE   Osteoporosis is a disease in which the bones lose minerals and strength with aging. This can result in serious bone fractures. Your risk for osteoporosis can be identified using a bone density scan.  If you are 61 years of age or older, or if you are at risk for osteoporosis and fractures, ask your health care provider if you should be screened.  Ask your health care provider whether you should take a calcium or vitamin D supplement to lower your risk for osteoporosis.  Menopause may have certain physical symptoms and risks.  Hormone replacement therapy may reduce some of these symptoms and risks. Talk to your health care provider about whether hormone replacement therapy is right for you.  HOME CARE INSTRUCTIONS   Schedule regular health, dental, and eye exams.  Stay current with your immunizations.   Do not use any tobacco products including cigarettes, chewing tobacco, or electronic cigarettes.  If you are pregnant, do not drink alcohol.  If you are breastfeeding, limit how much and how often you drink alcohol.  Limit alcohol intake to no more than 1 drink per day for nonpregnant women. One drink equals 12 ounces of beer, 5 ounces of wine, or 1 ounces of hard liquor.  Do not use street drugs.  Do not share needles.  Ask your health care provider for help if  you need support or information about quitting drugs.  Tell your health care provider if you often feel depressed.  Tell your health care provider if you have ever been abused or do not feel safe at home.   This information is not intended to replace advice given to you by your health care provider. Make sure you discuss any questions you have with your health care provider.   Document Released: 10/04/2010 Document Revised: 04/11/2014 Document Reviewed: 02/20/2013 Elsevier Interactive Patient Education [employer].

## 2015-12-16 NOTE — Progress Notes (Signed)
Maureen GasserSusanne M Roth 02/20/1971 [id number]    History:    Presents for breast and pelvic exam. History of spastic cerebral palsy motorized wheelchair-bound can stand /pivot for toileting. Lives in a group home. Parents are involved and see often. Amenorrheic with Depo-Provera. Ultrasound annually. Reports normal mammogram history instructed to have mammogram reports sent. Has had problems with superficial rashes below breasts and in axilla has had good relief with Mycolog and Valisone. Having occasional hot flushes.  Past medical history, past surgical history, family history and social history were all reviewed and documented in the EPIC chart.  ROS:  A ROS was performed and pertinent positives and negatives are included.  Exam:  There were no vitals filed for this visit. There is no height or weight on file to calculate BMI.   General appearance:  Normal Thyroid:  Symmetrical, normal in size, without palpable masses or nodularity. Respiratory  Auscultation:  Clear without wheezing or rhonchi Cardiovascular  Auscultation:  Regular rate, without rubs, murmurs or gallops  Edema/varicosities:  Not grossly evident Abdominal  Soft,nontender, without masses, guarding or rebound.  Liver/spleen:  No organomegaly noted  Hernia:  None appreciated  Skin  Inspection:  Grossly normal   Breasts: Examined lying and sitting.     Right: Without masses, retractions, discharge or axillary adenopathy.     Left: Without masses, retractions, discharge or axillary adenopathy.  Ultrasound: T/A anteverted uterus homogeneous. Thin endometrium. Endometrium 3 mm. Right ovary normal follicles 8 mm. Left ovary normal. Negative cul-de-sac.  Assessment/Plan:  45 y.o.  S WF /Virgin for rest and pelvic exam.  Amenorrheic on Depo-Provera Spastic cerebral palsy Seasonal allergy-primary care manages  Plan: Depo-Provera 150 every 12 weeks prescription, proper use given and reviewed will return for injections. Has  annual flu shot at her group home. Annual mammogram encouraged. Prescriptions for Mycolog and Valisone given if needed for rashes. UA  YOUNG,Maureen Roth, 5:00 PM 12/16/2015

## 2015-12-17 ENCOUNTER — Other Ambulatory Visit: Payer: Self-pay | Admitting: Women's Health

## 2015-12-17 DIAGNOSIS — Z1231 Encounter for screening mammogram for malignant neoplasm of breast: Secondary | ICD-10-CM

## 2015-12-24 ENCOUNTER — Ambulatory Visit
Admission: RE | Admit: 2015-12-24 | Discharge: 2015-12-24 | Disposition: A | Discharge status: Home / Self Care | Payer: Medicare Other | Source: Ambulatory Visit | Attending: Women's Health | Admitting: Women's Health

## 2015-12-24 DIAGNOSIS — Z1231 Encounter for screening mammogram for malignant neoplasm of breast: Secondary | ICD-10-CM | POA: Diagnosis not present

## 2016-01-20 ENCOUNTER — Other Ambulatory Visit: Payer: Self-pay | Admitting: Women's Health

## 2016-02-16 ENCOUNTER — Ambulatory Visit (INDEPENDENT_AMBULATORY_CARE_PROVIDER_SITE_OTHER): Payer: Medicare Other | Admitting: Anesthesiology

## 2016-02-16 DIAGNOSIS — Z3042 Encounter for surveillance of injectable contraceptive: Secondary | ICD-10-CM | POA: Diagnosis not present

## 2016-02-16 MED ORDER — MEDROXYPROGESTERONE ACETATE 150 MG/ML IM SUSP
150.0000 mg | Freq: Once | INTRAMUSCULAR | Status: AC
  Administered 2016-02-16: 150 mg via INTRAMUSCULAR

## 2016-04-14 ENCOUNTER — Other Ambulatory Visit: Payer: Self-pay | Admitting: Women's Health

## 2016-05-17 ENCOUNTER — Ambulatory Visit (INDEPENDENT_AMBULATORY_CARE_PROVIDER_SITE_OTHER): Payer: Medicare Other | Admitting: Anesthesiology

## 2016-05-17 DIAGNOSIS — Z3042 Encounter for surveillance of injectable contraceptive: Secondary | ICD-10-CM | POA: Diagnosis not present

## 2016-05-17 MED ORDER — MEDROXYPROGESTERONE ACETATE 150 MG/ML IM SUSP
150.0000 mg | Freq: Once | INTRAMUSCULAR | Status: AC
  Administered 2016-05-17: 150 mg via INTRAMUSCULAR

## 2016-08-16 ENCOUNTER — Ambulatory Visit (INDEPENDENT_AMBULATORY_CARE_PROVIDER_SITE_OTHER): Payer: Medicare Other | Admitting: *Deleted

## 2016-08-16 DIAGNOSIS — Z3042 Encounter for surveillance of injectable contraceptive: Secondary | ICD-10-CM

## 2016-08-16 MED ORDER — MEDROXYPROGESTERONE ACETATE 150 MG/ML IM SUSP
150.0000 mg | Freq: Once | INTRAMUSCULAR | Status: AC
  Administered 2016-08-16: 150 mg via INTRAMUSCULAR

## 2016-08-17 ENCOUNTER — Encounter: Payer: Self-pay | Admitting: Gynecology

## 2016-11-08 ENCOUNTER — Ambulatory Visit (INDEPENDENT_AMBULATORY_CARE_PROVIDER_SITE_OTHER): Payer: Medicare Other | Admitting: *Deleted

## 2016-11-08 DIAGNOSIS — Z3042 Encounter for surveillance of injectable contraceptive: Secondary | ICD-10-CM | POA: Diagnosis not present

## 2016-11-08 MED ORDER — MEDROXYPROGESTERONE ACETATE 150 MG/ML IM SUSP
150.0000 mg | Freq: Once | INTRAMUSCULAR | Status: AC
  Administered 2016-11-08: 150 mg via INTRAMUSCULAR

## 2016-11-17 ENCOUNTER — Other Ambulatory Visit: Payer: Self-pay | Admitting: Family Medicine

## 2016-11-17 ENCOUNTER — Other Ambulatory Visit: Payer: Self-pay | Admitting: Women's Health

## 2016-11-17 DIAGNOSIS — Z1231 Encounter for screening mammogram for malignant neoplasm of breast: Secondary | ICD-10-CM

## 2016-12-27 ENCOUNTER — Ambulatory Visit
Admission: RE | Admit: 2016-12-27 | Discharge: 2016-12-27 | Disposition: A | Discharge status: Home / Self Care | Payer: Medicare Other | Source: Ambulatory Visit | Attending: Family Medicine | Admitting: Family Medicine

## 2016-12-27 DIAGNOSIS — Z1231 Encounter for screening mammogram for malignant neoplasm of breast: Secondary | ICD-10-CM

## 2017-01-16 DIAGNOSIS — Z23 Encounter for immunization: Secondary | ICD-10-CM | POA: Diagnosis not present

## 2017-01-16 DIAGNOSIS — M25551 Pain in right hip: Secondary | ICD-10-CM | POA: Diagnosis not present

## 2017-01-16 DIAGNOSIS — T148XXA Other injury of unspecified body region, initial encounter: Secondary | ICD-10-CM | POA: Diagnosis not present

## 2017-01-16 DIAGNOSIS — R030 Elevated blood-pressure reading, without diagnosis of hypertension: Secondary | ICD-10-CM | POA: Diagnosis not present

## 2017-01-16 DIAGNOSIS — H6120 Impacted cerumen, unspecified ear: Secondary | ICD-10-CM | POA: Diagnosis not present

## 2017-01-24 ENCOUNTER — Other Ambulatory Visit: Payer: Self-pay | Admitting: Women's Health

## 2017-01-24 DIAGNOSIS — M79671 Pain in right foot: Secondary | ICD-10-CM | POA: Diagnosis not present

## 2017-01-24 DIAGNOSIS — M25551 Pain in right hip: Secondary | ICD-10-CM | POA: Diagnosis not present

## 2017-01-30 ENCOUNTER — Other Ambulatory Visit: Payer: Self-pay | Admitting: Women's Health

## 2017-01-30 MED ORDER — MEDROXYPROGESTERONE ACETATE 150 MG/ML IM SUSP: INTRAMUSCULAR | 0 refills | Status: DC

## 2017-02-03 ENCOUNTER — Other Ambulatory Visit: Payer: Self-pay | Admitting: Women's Health

## 2017-02-07 ENCOUNTER — Ambulatory Visit (INDEPENDENT_AMBULATORY_CARE_PROVIDER_SITE_OTHER): Payer: Medicare Other | Admitting: *Deleted

## 2017-02-07 DIAGNOSIS — Z3042 Encounter for surveillance of injectable contraceptive: Secondary | ICD-10-CM | POA: Diagnosis not present

## 2017-02-07 MED ORDER — MEDROXYPROGESTERONE ACETATE 150 MG/ML IM SUSP
150.0000 mg | Freq: Once | INTRAMUSCULAR | Status: AC
  Administered 2017-02-07: 150 mg via INTRAMUSCULAR

## 2017-02-13 ENCOUNTER — Other Ambulatory Visit: Payer: Self-pay | Admitting: Women's Health

## 2017-02-13 DIAGNOSIS — Z3042 Encounter for surveillance of injectable contraceptive: Secondary | ICD-10-CM

## 2017-02-13 DIAGNOSIS — N912 Amenorrhea, unspecified: Secondary | ICD-10-CM

## 2017-03-08 ENCOUNTER — Ambulatory Visit (INDEPENDENT_AMBULATORY_CARE_PROVIDER_SITE_OTHER): Payer: Medicare Other

## 2017-03-08 ENCOUNTER — Encounter: Payer: Self-pay | Admitting: Women's Health

## 2017-03-08 ENCOUNTER — Ambulatory Visit (INDEPENDENT_AMBULATORY_CARE_PROVIDER_SITE_OTHER): Payer: Medicare Other | Admitting: Women's Health

## 2017-03-08 VITALS — BP 117/75

## 2017-03-08 DIAGNOSIS — Z9189 Other specified personal risk factors, not elsewhere classified: Secondary | ICD-10-CM

## 2017-03-08 DIAGNOSIS — N938 Other specified abnormal uterine and vaginal bleeding: Secondary | ICD-10-CM | POA: Diagnosis not present

## 2017-03-08 DIAGNOSIS — N912 Amenorrhea, unspecified: Secondary | ICD-10-CM | POA: Diagnosis not present

## 2017-03-08 DIAGNOSIS — R350 Frequency of micturition: Secondary | ICD-10-CM

## 2017-03-08 DIAGNOSIS — Z0000 Encounter for general adult medical examination without abnormal findings: Secondary | ICD-10-CM | POA: Diagnosis not present

## 2017-03-08 DIAGNOSIS — Z01419 Encounter for gynecological examination (general) (routine) without abnormal findings: Secondary | ICD-10-CM

## 2017-03-08 MED ORDER — MEDROXYPROGESTERONE ACETATE 150 MG/ML IM SUSP: INTRAMUSCULAR | 4 refills | Status: DC

## 2017-03-08 NOTE — Progress Notes (Signed)
Maureen GasserSusanne M Roth 04/16/1970 [id number]    History:    Presents for breast and pelvic exam. On Depo-Provera has occasional breakthrough bleeding. Normal mammogram 12/2016. Virgin has not had a Pap smear/ spastic CP. Uses a motorized wheelchair, able to palpate it, lives in a group home, parents are supportive. Annual ultrasound. Labs at primary care. Has an annual flu shot.   Past medical history, past surgical history, family history and social history were all reviewed and documented in the EPIC chart.  ROS:  A ROS was performed and pertinent positives and negatives are included.  Exam:  Vitals:   03/08/17 1601  BP: 117/75   There is no height or weight on file to calculate BMI.   General appearance:  Normal Thyroid:  Symmetrical, normal in size, without palpable masses or nodularity. Respiratory  Auscultation:  Clear without wheezing or rhonchi Cardiovascular  Auscultation:  Regular rate, without rubs, murmurs or gallops  Edema/varicosities:  Not grossly evident Abdominal  Soft,nontender, without masses, guarding or rebound.  Liver/spleen:  No organomegaly noted  Hernia:  None appreciated  Skin  Inspection:  Grossly normal   Breasts: Examined lying and sitting.     Right: Without masses, retractions, discharge or axillary adenopathy.     Left: Without masses, retractions, discharge or axillary adenopathy. Gentitourinary   Inguinal/mons:  Normal without inguinal adenopathy  External genitalia:  Normal Ultrasound: T/V uterus anteverted homogeneous. Endometrium within normal limits. Right and left ovary normal. Negative cul-de-sac. No apparent mass in the right or left adnexal. Endometrium 2.2 mm.   Assessment/Plan:  46 y.o. S WF [facility] for breast and pelvic exam with mild urinary frequency, UA negative..  Depo-Provera occasional breakthrough bleeding Spastic CP /wheelchair-bound  Plan: Depo-Provera 150 daily 12 weeks, prescription, proper use given and reviewed. Will call if  continued problems with breakthrough bleeding. SBE's and exercise as best able.    Harrington Challengerancy J Giancarlos Berendt [facility]NP, 4:48 PM 03/08/2017

## 2017-03-09 LAB — URINALYSIS W MICROSCOPIC + REFLEX CULTURE
BILIRUBIN URINE: NEGATIVE
Bacteria, UA: NONE SEEN /HPF
GLUCOSE, UA: NEGATIVE
Hyaline Cast: NONE SEEN /LPF
Ketones, ur: NEGATIVE
LEUKOCYTE ESTERASE: NEGATIVE
NITRITES URINE, INITIAL: NEGATIVE
PROTEIN: NEGATIVE
SPECIFIC GRAVITY, URINE: 1.004 (ref 1.001–1.03)
pH: 6.5 (ref 5.0–8.0)

## 2017-03-09 LAB — NO CULTURE INDICATED

## 2017-04-27 ENCOUNTER — Other Ambulatory Visit: Payer: Self-pay | Admitting: Women's Health

## 2017-04-27 DIAGNOSIS — N912 Amenorrhea, unspecified: Secondary | ICD-10-CM

## 2017-04-28 ENCOUNTER — Ambulatory Visit (INDEPENDENT_AMBULATORY_CARE_PROVIDER_SITE_OTHER): Payer: Medicare Other | Admitting: *Deleted

## 2017-04-28 DIAGNOSIS — Z3042 Encounter for surveillance of injectable contraceptive: Secondary | ICD-10-CM

## 2017-04-28 MED ORDER — MEDROXYPROGESTERONE ACETATE 150 MG/ML IM SUSP
150.0000 mg | Freq: Once | INTRAMUSCULAR | Status: AC
  Administered 2017-04-28: 150 mg via INTRAMUSCULAR

## 2017-05-09 ENCOUNTER — Other Ambulatory Visit: Payer: Self-pay | Admitting: Women's Health

## 2017-05-09 NOTE — Telephone Encounter (Signed)
I spoke with Mom. She said she was not aware patient was using eye drops. She is going to call "the home" and inquire. She said if anyone was prescribing them it would be her PCP Shaune Pollackonna Gates.  I denied the refill and directed pharmacy to check with her. Mom understood that we were going to ask PCP to fill this for her.

## 2017-05-09 NOTE — Telephone Encounter (Signed)
Harriett SineNancy, I do not see this in her medication list.  Just wanted to check with you prior to refusing it.

## 2017-05-09 NOTE — Telephone Encounter (Signed)
Please call her mom and see why she is on this, who has prescribed in past?  she lives in an assisted living, wheelchair-bound.

## 2017-05-18 ENCOUNTER — Other Ambulatory Visit: Payer: Self-pay | Admitting: Women's Health

## 2017-05-18 NOTE — Addendum Note (Signed)
Addended by: Aura CampsWEBB, JENNIFER L on: 05/18/2017 02:09 PM   Modules accepted: Orders

## 2017-05-18 NOTE — Telephone Encounter (Signed)
Ok for refills

## 2017-05-18 NOTE — Telephone Encounter (Signed)
Annual was on 03/08/17

## 2017-06-27 ENCOUNTER — Other Ambulatory Visit: Payer: Self-pay | Admitting: Women's Health

## 2017-07-24 ENCOUNTER — Ambulatory Visit (INDEPENDENT_AMBULATORY_CARE_PROVIDER_SITE_OTHER): Payer: Medicare Other | Admitting: Gynecology

## 2017-07-24 DIAGNOSIS — Z3042 Encounter for surveillance of injectable contraceptive: Secondary | ICD-10-CM

## 2017-07-24 MED ORDER — MEDROXYPROGESTERONE ACETATE 150 MG/ML IM SUSP
150.0000 mg | Freq: Once | INTRAMUSCULAR | Status: AC
  Administered 2017-07-24: 150 mg via INTRAMUSCULAR

## 2017-10-19 ENCOUNTER — Ambulatory Visit (INDEPENDENT_AMBULATORY_CARE_PROVIDER_SITE_OTHER): Payer: Medicare Other | Admitting: Gynecology

## 2017-10-19 DIAGNOSIS — Z3042 Encounter for surveillance of injectable contraceptive: Secondary | ICD-10-CM | POA: Diagnosis not present

## 2017-10-19 MED ORDER — MEDROXYPROGESTERONE ACETATE 150 MG/ML IM SUSP
150.0000 mg | Freq: Once | INTRAMUSCULAR | Status: AC
  Administered 2017-10-19: 150 mg via INTRAMUSCULAR

## 2017-11-22 ENCOUNTER — Other Ambulatory Visit: Payer: Self-pay | Admitting: Family Medicine

## 2017-11-22 DIAGNOSIS — Z1231 Encounter for screening mammogram for malignant neoplasm of breast: Secondary | ICD-10-CM

## 2017-12-22 DIAGNOSIS — M92211 Osteochondrosis (juvenile) of carpal lunate [Kienbock], right hand: Secondary | ICD-10-CM | POA: Diagnosis not present

## 2017-12-22 DIAGNOSIS — R52 Pain, unspecified: Secondary | ICD-10-CM

## 2017-12-22 HISTORY — DX: Pain, unspecified: R52

## 2017-12-27 ENCOUNTER — Other Ambulatory Visit: Payer: Self-pay | Admitting: Orthopedic Surgery

## 2017-12-27 DIAGNOSIS — M92211 Osteochondrosis (juvenile) of carpal lunate [Kienbock], right hand: Secondary | ICD-10-CM

## 2017-12-29 ENCOUNTER — Ambulatory Visit
Admission: RE | Admit: 2017-12-29 | Discharge: 2017-12-29 | Disposition: A | Discharge status: Home / Self Care | Payer: Medicare Other | Source: Ambulatory Visit | Attending: Family Medicine | Admitting: Family Medicine

## 2017-12-29 DIAGNOSIS — H2513 Age-related nuclear cataract, bilateral: Secondary | ICD-10-CM | POA: Diagnosis not present

## 2017-12-29 DIAGNOSIS — H04123 Dry eye syndrome of bilateral lacrimal glands: Secondary | ICD-10-CM | POA: Diagnosis not present

## 2017-12-29 DIAGNOSIS — Z1231 Encounter for screening mammogram for malignant neoplasm of breast: Secondary | ICD-10-CM

## 2018-01-03 ENCOUNTER — Other Ambulatory Visit: Payer: Self-pay | Admitting: *Deleted

## 2018-01-03 DIAGNOSIS — Z01419 Encounter for gynecological examination (general) (routine) without abnormal findings: Secondary | ICD-10-CM

## 2018-01-04 ENCOUNTER — Ambulatory Visit
Admission: RE | Admit: 2018-01-04 | Discharge: 2018-01-04 | Disposition: A | Discharge status: Home / Self Care | Payer: Medicare Other | Source: Ambulatory Visit | Attending: Orthopedic Surgery | Admitting: Orthopedic Surgery

## 2018-01-04 DIAGNOSIS — G809 Cerebral palsy, unspecified: Secondary | ICD-10-CM | POA: Diagnosis not present

## 2018-01-04 DIAGNOSIS — H6120 Impacted cerumen, unspecified ear: Secondary | ICD-10-CM | POA: Diagnosis not present

## 2018-01-04 DIAGNOSIS — Z23 Encounter for immunization: Secondary | ICD-10-CM | POA: Diagnosis not present

## 2018-01-04 DIAGNOSIS — M25531 Pain in right wrist: Secondary | ICD-10-CM | POA: Diagnosis not present

## 2018-01-04 DIAGNOSIS — M92211 Osteochondrosis (juvenile) of carpal lunate [Kienbock], right hand: Secondary | ICD-10-CM

## 2018-01-04 DIAGNOSIS — Z131 Encounter for screening for diabetes mellitus: Secondary | ICD-10-CM | POA: Diagnosis not present

## 2018-01-04 DIAGNOSIS — G8250 Quadriplegia, unspecified: Secondary | ICD-10-CM | POA: Diagnosis not present

## 2018-01-04 DIAGNOSIS — Z0000 Encounter for general adult medical examination without abnormal findings: Secondary | ICD-10-CM | POA: Diagnosis not present

## 2018-01-12 ENCOUNTER — Ambulatory Visit (INDEPENDENT_AMBULATORY_CARE_PROVIDER_SITE_OTHER): Payer: Medicare Other | Admitting: *Deleted

## 2018-01-12 DIAGNOSIS — Z3042 Encounter for surveillance of injectable contraceptive: Secondary | ICD-10-CM

## 2018-01-12 MED ORDER — MEDROXYPROGESTERONE ACETATE 150 MG/ML IM SUSP
150.0000 mg | Freq: Once | INTRAMUSCULAR | Status: AC
  Administered 2018-01-12: 150 mg via INTRAMUSCULAR

## 2018-01-17 DIAGNOSIS — M92211 Osteochondrosis (juvenile) of carpal lunate [Kienbock], right hand: Secondary | ICD-10-CM | POA: Diagnosis not present

## 2018-03-13 ENCOUNTER — Ambulatory Visit: Payer: Medicare Other

## 2018-03-13 ENCOUNTER — Ambulatory Visit (INDEPENDENT_AMBULATORY_CARE_PROVIDER_SITE_OTHER): Payer: Medicare Other | Admitting: Women's Health

## 2018-03-13 ENCOUNTER — Ambulatory Visit (INDEPENDENT_AMBULATORY_CARE_PROVIDER_SITE_OTHER): Payer: Medicare Other

## 2018-03-13 ENCOUNTER — Other Ambulatory Visit: Payer: Self-pay | Admitting: Women's Health

## 2018-03-13 ENCOUNTER — Encounter: Payer: Self-pay | Admitting: Women's Health

## 2018-03-13 VITALS — BP 115/74

## 2018-03-13 DIAGNOSIS — N912 Amenorrhea, unspecified: Secondary | ICD-10-CM

## 2018-03-13 DIAGNOSIS — R21 Rash and other nonspecific skin eruption: Secondary | ICD-10-CM

## 2018-03-13 DIAGNOSIS — Z01419 Encounter for gynecological examination (general) (routine) without abnormal findings: Secondary | ICD-10-CM

## 2018-03-13 MED ORDER — BETAMETHASONE VALERATE 0.1 % EX CREA: TOPICAL_CREAM | CUTANEOUS | 1 refills | Status: DC

## 2018-03-13 MED ORDER — NYSTATIN 100000 UNIT/GM EX OINT: TOPICAL_OINTMENT | CUTANEOUS | 4 refills | Status: DC

## 2018-03-13 MED ORDER — MEDROXYPROGESTERONE ACETATE 150 MG/ML IM SUSP: INTRAMUSCULAR | 3 refills | Status: DC

## 2018-03-13 MED ORDER — FLUTICASONE PROPIONATE 50 MCG/ACT NA SUSP: 1.0000 | Freq: Every day | NASAL | 6 refills | Status: DC

## 2018-03-13 NOTE — Progress Notes (Signed)
Maureen GasserSusanne M Brow 06/13/1970 [id number]    History:    Presents for US.  Spastic CP minimal mobility.  Amenorrhea on Depo-Provera.  Virgin.  Normal pap 2008.  Normal mammogram history.  Lives in a group home since age 47.  Parents very supportive, visit often and has home visits also.  Parents help in and out of electric wheelchair.  Is able to stand with a stander and has a lift chair at her group home.   Past medical history, past surgical history, family history and social history were all reviewed and documented in the EPIC chart.  ROS:  A ROS was performed and pertinent positives and negatives are included.  Exam:  Vitals:   03/13/18 1127  BP: 115/74   There is no height or weight on file to calculate BMI.   General appearance:  Normal Thyroid:  Symmetrical, normal in size, without palpable masses or nodularity. Respiratory  Auscultation:  Clear without wheezing or rhonchi Cardiovascular  Auscultation:  Regular rate, without rubs, murmurs or gallops  Edema/varicosities:  Not grossly evident Abdominal  Soft,nontender, without masses, guarding or rebound.  Liver/spleen:  No organomegaly noted  Hernia:  None appreciated  Skin  Inspection:  Grossly normal   Breasts: Examined lying and sitting.     Right: Visible bruise on the right inner breast possibly from mild trauma with pivoting/turning.  Nontender.  No palpable masses, nodules or nipple discharge.     Left: Without masses, retractions, discharge or axillary adenopathy.  Ultrasound: T/A uterus anteverted homogeneous endometrium 1.5 mm.  Right and left ovary normal.  Negative cul-de-sac.  No apparent mass right or left adnexal.  Assessment/Plan:  47 y.o. S WF virgin for ultrasound and surveillance of Depo-Provera.  Amenorrhea with Depo-Provera Spastic CP/slow speech Primary care manages labs  Plan: Depo-Provera 150 mg every 12 weeks.  Having no menopausal symptoms at this time. Continue annual screening mammogram.   Strongly encouraged to increase frequency of standing in stander to at least 5 times weekly at group home.  Encouragedmother to keep an eye on right inner breast bruise most likely caused from mobility assistance. (no other bruising noted) refill of Valisone and nystatin given for occasional irritation/itching under breasts.      Harrington Challengerancy J  [facility] Center[facility]NP, 12:42 PM 03/13/2018

## 2018-03-14 ENCOUNTER — Other Ambulatory Visit: Payer: Self-pay

## 2018-03-14 DIAGNOSIS — N912 Amenorrhea, unspecified: Secondary | ICD-10-CM

## 2018-03-14 DIAGNOSIS — R21 Rash and other nonspecific skin eruption: Secondary | ICD-10-CM

## 2018-03-14 NOTE — Telephone Encounter (Signed)
Refill all,  I thought I refilled yesterday???

## 2018-03-14 NOTE — Telephone Encounter (Signed)
Harriett Sineancy, so sorry I did not look at her med list first. It appears that the paperwork I received is not actually  refill requests but just showing the Rx's you sent yesterday.  Very confusing.

## 2018-04-13 ENCOUNTER — Ambulatory Visit (INDEPENDENT_AMBULATORY_CARE_PROVIDER_SITE_OTHER): Payer: Medicare Other | Admitting: *Deleted

## 2018-04-13 DIAGNOSIS — Z3042 Encounter for surveillance of injectable contraceptive: Secondary | ICD-10-CM | POA: Diagnosis not present

## 2018-04-13 MED ORDER — MEDROXYPROGESTERONE ACETATE 150 MG/ML IM SUSP
150.0000 mg | Freq: Once | INTRAMUSCULAR | Status: AC
  Administered 2018-04-13: 150 mg via INTRAMUSCULAR

## 2018-04-20 DIAGNOSIS — E538 Deficiency of other specified B group vitamins: Secondary | ICD-10-CM | POA: Diagnosis not present

## 2018-04-20 DIAGNOSIS — I7300 Raynaud's syndrome without gangrene: Secondary | ICD-10-CM | POA: Diagnosis not present

## 2018-04-20 DIAGNOSIS — R600 Localized edema: Secondary | ICD-10-CM | POA: Diagnosis not present

## 2018-04-20 DIAGNOSIS — G809 Cerebral palsy, unspecified: Secondary | ICD-10-CM | POA: Diagnosis not present

## 2018-04-20 DIAGNOSIS — M21379 Foot drop, unspecified foot: Secondary | ICD-10-CM | POA: Diagnosis not present

## 2018-04-20 DIAGNOSIS — Z79899 Other long term (current) drug therapy: Secondary | ICD-10-CM | POA: Diagnosis not present

## 2018-04-20 DIAGNOSIS — M25559 Pain in unspecified hip: Secondary | ICD-10-CM | POA: Diagnosis not present

## 2018-04-20 DIAGNOSIS — E559 Vitamin D deficiency, unspecified: Secondary | ICD-10-CM | POA: Diagnosis not present

## 2018-04-27 DIAGNOSIS — G40909 Epilepsy, unspecified, not intractable, without status epilepticus: Secondary | ICD-10-CM | POA: Diagnosis not present

## 2018-04-27 DIAGNOSIS — M25559 Pain in unspecified hip: Secondary | ICD-10-CM | POA: Diagnosis not present

## 2018-04-27 DIAGNOSIS — R600 Localized edema: Secondary | ICD-10-CM | POA: Diagnosis not present

## 2018-04-27 DIAGNOSIS — M21372 Foot drop, left foot: Secondary | ICD-10-CM | POA: Diagnosis not present

## 2018-04-27 DIAGNOSIS — E559 Vitamin D deficiency, unspecified: Secondary | ICD-10-CM | POA: Diagnosis not present

## 2018-04-27 DIAGNOSIS — G800 Spastic quadriplegic cerebral palsy: Secondary | ICD-10-CM | POA: Diagnosis not present

## 2018-04-27 DIAGNOSIS — I7300 Raynaud's syndrome without gangrene: Secondary | ICD-10-CM | POA: Diagnosis not present

## 2018-05-01 DIAGNOSIS — M21372 Foot drop, left foot: Secondary | ICD-10-CM | POA: Diagnosis not present

## 2018-05-01 DIAGNOSIS — R600 Localized edema: Secondary | ICD-10-CM | POA: Diagnosis not present

## 2018-05-01 DIAGNOSIS — G800 Spastic quadriplegic cerebral palsy: Secondary | ICD-10-CM | POA: Diagnosis not present

## 2018-05-01 DIAGNOSIS — I7300 Raynaud's syndrome without gangrene: Secondary | ICD-10-CM | POA: Diagnosis not present

## 2018-05-01 DIAGNOSIS — E559 Vitamin D deficiency, unspecified: Secondary | ICD-10-CM | POA: Diagnosis not present

## 2018-05-01 DIAGNOSIS — M25559 Pain in unspecified hip: Secondary | ICD-10-CM | POA: Diagnosis not present

## 2018-05-04 DIAGNOSIS — E538 Deficiency of other specified B group vitamins: Secondary | ICD-10-CM | POA: Diagnosis not present

## 2018-05-07 DIAGNOSIS — I7300 Raynaud's syndrome without gangrene: Secondary | ICD-10-CM | POA: Diagnosis not present

## 2018-05-07 DIAGNOSIS — M25559 Pain in unspecified hip: Secondary | ICD-10-CM | POA: Diagnosis not present

## 2018-05-07 DIAGNOSIS — E559 Vitamin D deficiency, unspecified: Secondary | ICD-10-CM | POA: Diagnosis not present

## 2018-05-07 DIAGNOSIS — G800 Spastic quadriplegic cerebral palsy: Secondary | ICD-10-CM | POA: Diagnosis not present

## 2018-05-07 DIAGNOSIS — R600 Localized edema: Secondary | ICD-10-CM | POA: Diagnosis not present

## 2018-05-07 DIAGNOSIS — M21372 Foot drop, left foot: Secondary | ICD-10-CM | POA: Diagnosis not present

## 2018-05-08 DIAGNOSIS — E559 Vitamin D deficiency, unspecified: Secondary | ICD-10-CM | POA: Diagnosis not present

## 2018-05-08 DIAGNOSIS — R600 Localized edema: Secondary | ICD-10-CM | POA: Diagnosis not present

## 2018-05-08 DIAGNOSIS — M25559 Pain in unspecified hip: Secondary | ICD-10-CM | POA: Diagnosis not present

## 2018-05-08 DIAGNOSIS — M21372 Foot drop, left foot: Secondary | ICD-10-CM | POA: Diagnosis not present

## 2018-05-08 DIAGNOSIS — G800 Spastic quadriplegic cerebral palsy: Secondary | ICD-10-CM | POA: Diagnosis not present

## 2018-05-08 DIAGNOSIS — I7300 Raynaud's syndrome without gangrene: Secondary | ICD-10-CM | POA: Diagnosis not present

## 2018-05-09 DIAGNOSIS — M21372 Foot drop, left foot: Secondary | ICD-10-CM | POA: Diagnosis not present

## 2018-05-09 DIAGNOSIS — M25559 Pain in unspecified hip: Secondary | ICD-10-CM | POA: Diagnosis not present

## 2018-05-09 DIAGNOSIS — E559 Vitamin D deficiency, unspecified: Secondary | ICD-10-CM | POA: Diagnosis not present

## 2018-05-09 DIAGNOSIS — I7300 Raynaud's syndrome without gangrene: Secondary | ICD-10-CM | POA: Diagnosis not present

## 2018-05-09 DIAGNOSIS — R600 Localized edema: Secondary | ICD-10-CM | POA: Diagnosis not present

## 2018-05-09 DIAGNOSIS — G800 Spastic quadriplegic cerebral palsy: Secondary | ICD-10-CM | POA: Diagnosis not present

## 2018-05-11 DIAGNOSIS — R600 Localized edema: Secondary | ICD-10-CM | POA: Diagnosis not present

## 2018-05-11 DIAGNOSIS — M25559 Pain in unspecified hip: Secondary | ICD-10-CM | POA: Diagnosis not present

## 2018-05-11 DIAGNOSIS — I7300 Raynaud's syndrome without gangrene: Secondary | ICD-10-CM | POA: Diagnosis not present

## 2018-05-11 DIAGNOSIS — E559 Vitamin D deficiency, unspecified: Secondary | ICD-10-CM | POA: Diagnosis not present

## 2018-05-11 DIAGNOSIS — M21372 Foot drop, left foot: Secondary | ICD-10-CM | POA: Diagnosis not present

## 2018-05-11 DIAGNOSIS — G800 Spastic quadriplegic cerebral palsy: Secondary | ICD-10-CM | POA: Diagnosis not present

## 2018-05-14 DIAGNOSIS — G800 Spastic quadriplegic cerebral palsy: Secondary | ICD-10-CM | POA: Diagnosis not present

## 2018-05-14 DIAGNOSIS — I7300 Raynaud's syndrome without gangrene: Secondary | ICD-10-CM | POA: Diagnosis not present

## 2018-05-14 DIAGNOSIS — E559 Vitamin D deficiency, unspecified: Secondary | ICD-10-CM | POA: Diagnosis not present

## 2018-05-14 DIAGNOSIS — M25559 Pain in unspecified hip: Secondary | ICD-10-CM | POA: Diagnosis not present

## 2018-05-14 DIAGNOSIS — M21372 Foot drop, left foot: Secondary | ICD-10-CM | POA: Diagnosis not present

## 2018-05-14 DIAGNOSIS — R600 Localized edema: Secondary | ICD-10-CM | POA: Diagnosis not present

## 2018-05-15 DIAGNOSIS — R600 Localized edema: Secondary | ICD-10-CM | POA: Diagnosis not present

## 2018-05-15 DIAGNOSIS — G800 Spastic quadriplegic cerebral palsy: Secondary | ICD-10-CM | POA: Diagnosis not present

## 2018-05-15 DIAGNOSIS — M25559 Pain in unspecified hip: Secondary | ICD-10-CM | POA: Diagnosis not present

## 2018-05-15 DIAGNOSIS — I7300 Raynaud's syndrome without gangrene: Secondary | ICD-10-CM | POA: Diagnosis not present

## 2018-05-15 DIAGNOSIS — M21372 Foot drop, left foot: Secondary | ICD-10-CM | POA: Diagnosis not present

## 2018-05-15 DIAGNOSIS — E559 Vitamin D deficiency, unspecified: Secondary | ICD-10-CM | POA: Diagnosis not present

## 2018-05-16 DIAGNOSIS — G800 Spastic quadriplegic cerebral palsy: Secondary | ICD-10-CM | POA: Diagnosis not present

## 2018-05-16 DIAGNOSIS — I7300 Raynaud's syndrome without gangrene: Secondary | ICD-10-CM | POA: Diagnosis not present

## 2018-05-16 DIAGNOSIS — M21372 Foot drop, left foot: Secondary | ICD-10-CM | POA: Diagnosis not present

## 2018-05-16 DIAGNOSIS — E559 Vitamin D deficiency, unspecified: Secondary | ICD-10-CM | POA: Diagnosis not present

## 2018-05-16 DIAGNOSIS — M25559 Pain in unspecified hip: Secondary | ICD-10-CM | POA: Diagnosis not present

## 2018-05-16 DIAGNOSIS — R600 Localized edema: Secondary | ICD-10-CM | POA: Diagnosis not present

## 2018-05-17 DIAGNOSIS — M21372 Foot drop, left foot: Secondary | ICD-10-CM | POA: Diagnosis not present

## 2018-05-17 DIAGNOSIS — G800 Spastic quadriplegic cerebral palsy: Secondary | ICD-10-CM | POA: Diagnosis not present

## 2018-05-17 DIAGNOSIS — M25559 Pain in unspecified hip: Secondary | ICD-10-CM | POA: Diagnosis not present

## 2018-05-17 DIAGNOSIS — R600 Localized edema: Secondary | ICD-10-CM | POA: Diagnosis not present

## 2018-05-17 DIAGNOSIS — E559 Vitamin D deficiency, unspecified: Secondary | ICD-10-CM | POA: Diagnosis not present

## 2018-05-17 DIAGNOSIS — I7300 Raynaud's syndrome without gangrene: Secondary | ICD-10-CM | POA: Diagnosis not present

## 2018-05-18 DIAGNOSIS — E538 Deficiency of other specified B group vitamins: Secondary | ICD-10-CM | POA: Diagnosis not present

## 2018-06-01 DIAGNOSIS — E538 Deficiency of other specified B group vitamins: Secondary | ICD-10-CM | POA: Diagnosis not present

## 2018-06-01 DIAGNOSIS — E539 Vitamin B deficiency, unspecified: Secondary | ICD-10-CM | POA: Diagnosis not present

## 2018-07-09 ENCOUNTER — Other Ambulatory Visit: Payer: Self-pay

## 2018-07-09 ENCOUNTER — Ambulatory Visit (INDEPENDENT_AMBULATORY_CARE_PROVIDER_SITE_OTHER): Payer: Medicare Other | Admitting: Gynecology

## 2018-07-09 DIAGNOSIS — Z3042 Encounter for surveillance of injectable contraceptive: Secondary | ICD-10-CM

## 2018-07-09 MED ORDER — MEDROXYPROGESTERONE ACETATE 150 MG/ML IM SUSP
150.0000 mg | Freq: Once | INTRAMUSCULAR | Status: AC
  Administered 2018-07-09: 150 mg via INTRAMUSCULAR

## 2018-07-18 DIAGNOSIS — M92211 Osteochondrosis (juvenile) of carpal lunate [Kienbock], right hand: Secondary | ICD-10-CM

## 2018-07-18 HISTORY — DX: Osteochondrosis (juvenile) of carpal lunate (kienbock), right hand: M92.211

## 2018-09-28 ENCOUNTER — Ambulatory Visit (INDEPENDENT_AMBULATORY_CARE_PROVIDER_SITE_OTHER): Payer: Medicare Other | Admitting: Anesthesiology

## 2018-09-28 DIAGNOSIS — Z3042 Encounter for surveillance of injectable contraceptive: Secondary | ICD-10-CM

## 2018-09-28 MED ORDER — MEDROXYPROGESTERONE ACETATE 150 MG/ML IM SUSP
150.0000 mg | Freq: Once | INTRAMUSCULAR | Status: AC
  Administered 2018-09-28: 150 mg via INTRAMUSCULAR

## 2018-11-20 ENCOUNTER — Other Ambulatory Visit: Payer: Self-pay | Admitting: Family Medicine

## 2018-11-20 DIAGNOSIS — Z1231 Encounter for screening mammogram for malignant neoplasm of breast: Secondary | ICD-10-CM

## 2018-12-26 ENCOUNTER — Other Ambulatory Visit: Payer: Self-pay

## 2018-12-26 ENCOUNTER — Ambulatory Visit (INDEPENDENT_AMBULATORY_CARE_PROVIDER_SITE_OTHER): Payer: Medicare Other | Admitting: Gynecology

## 2018-12-26 DIAGNOSIS — Z23 Encounter for immunization: Secondary | ICD-10-CM | POA: Diagnosis not present

## 2018-12-26 DIAGNOSIS — Z3042 Encounter for surveillance of injectable contraceptive: Secondary | ICD-10-CM

## 2018-12-26 MED ORDER — MEDROXYPROGESTERONE ACETATE 150 MG/ML IM SUSP
150.0000 mg | Freq: Once | INTRAMUSCULAR | Status: AC
  Administered 2018-12-26: 150 mg via INTRAMUSCULAR

## 2019-01-04 DIAGNOSIS — H2513 Age-related nuclear cataract, bilateral: Secondary | ICD-10-CM | POA: Diagnosis not present

## 2019-01-04 DIAGNOSIS — H04123 Dry eye syndrome of bilateral lacrimal glands: Secondary | ICD-10-CM | POA: Diagnosis not present

## 2019-01-07 ENCOUNTER — Other Ambulatory Visit: Payer: Self-pay

## 2019-01-07 ENCOUNTER — Ambulatory Visit
Admission: RE | Admit: 2019-01-07 | Discharge: 2019-01-07 | Disposition: A | Discharge status: Home / Self Care | Payer: Medicare Other | Source: Ambulatory Visit | Attending: Family Medicine | Admitting: Family Medicine

## 2019-01-07 DIAGNOSIS — Z1231 Encounter for screening mammogram for malignant neoplasm of breast: Secondary | ICD-10-CM | POA: Diagnosis not present

## 2019-01-08 ENCOUNTER — Other Ambulatory Visit: Payer: Self-pay | Admitting: Family Medicine

## 2019-01-08 DIAGNOSIS — R928 Other abnormal and inconclusive findings on diagnostic imaging of breast: Secondary | ICD-10-CM

## 2019-01-10 DIAGNOSIS — E7800 Pure hypercholesterolemia, unspecified: Secondary | ICD-10-CM | POA: Diagnosis not present

## 2019-01-10 DIAGNOSIS — E559 Vitamin D deficiency, unspecified: Secondary | ICD-10-CM | POA: Diagnosis not present

## 2019-01-10 DIAGNOSIS — L304 Erythema intertrigo: Secondary | ICD-10-CM | POA: Diagnosis not present

## 2019-01-10 DIAGNOSIS — Z0000 Encounter for general adult medical examination without abnormal findings: Secondary | ICD-10-CM | POA: Diagnosis not present

## 2019-01-10 DIAGNOSIS — G8250 Quadriplegia, unspecified: Secondary | ICD-10-CM | POA: Diagnosis not present

## 2019-01-10 DIAGNOSIS — E538 Deficiency of other specified B group vitamins: Secondary | ICD-10-CM | POA: Diagnosis not present

## 2019-01-14 ENCOUNTER — Other Ambulatory Visit: Payer: Self-pay

## 2019-01-14 ENCOUNTER — Ambulatory Visit
Admission: RE | Admit: 2019-01-14 | Discharge: 2019-01-14 | Disposition: A | Discharge status: Home / Self Care | Payer: Medicare Other | Source: Ambulatory Visit | Attending: Family Medicine | Admitting: Family Medicine

## 2019-01-14 ENCOUNTER — Other Ambulatory Visit: Payer: Self-pay | Admitting: Family Medicine

## 2019-01-14 DIAGNOSIS — R928 Other abnormal and inconclusive findings on diagnostic imaging of breast: Secondary | ICD-10-CM

## 2019-01-14 DIAGNOSIS — R921 Mammographic calcification found on diagnostic imaging of breast: Secondary | ICD-10-CM | POA: Diagnosis not present

## 2019-03-18 ENCOUNTER — Encounter: Payer: Self-pay | Admitting: Women's Health

## 2019-03-18 ENCOUNTER — Other Ambulatory Visit: Payer: Self-pay

## 2019-03-18 ENCOUNTER — Ambulatory Visit: Payer: Self-pay

## 2019-03-19 ENCOUNTER — Ambulatory Visit (INDEPENDENT_AMBULATORY_CARE_PROVIDER_SITE_OTHER): Payer: Medicare Other | Admitting: Women's Health

## 2019-03-19 ENCOUNTER — Other Ambulatory Visit: Payer: Self-pay | Admitting: Women's Health

## 2019-03-19 ENCOUNTER — Ambulatory Visit: Payer: Medicare Other

## 2019-03-19 ENCOUNTER — Ambulatory Visit (INDEPENDENT_AMBULATORY_CARE_PROVIDER_SITE_OTHER): Payer: Medicare Other

## 2019-03-19 ENCOUNTER — Encounter: Payer: Self-pay | Admitting: Women's Health

## 2019-03-19 VITALS — BP 110/78

## 2019-03-19 DIAGNOSIS — N8302 Follicular cyst of left ovary: Secondary | ICD-10-CM

## 2019-03-19 DIAGNOSIS — G801 Spastic diplegic cerebral palsy: Secondary | ICD-10-CM | POA: Diagnosis not present

## 2019-03-19 DIAGNOSIS — Z01419 Encounter for gynecological examination (general) (routine) without abnormal findings: Secondary | ICD-10-CM | POA: Diagnosis not present

## 2019-03-19 DIAGNOSIS — Z3042 Encounter for surveillance of injectable contraceptive: Secondary | ICD-10-CM

## 2019-03-19 DIAGNOSIS — N854 Malposition of uterus: Secondary | ICD-10-CM

## 2019-03-19 DIAGNOSIS — N912 Amenorrhea, unspecified: Secondary | ICD-10-CM | POA: Diagnosis not present

## 2019-03-19 MED ORDER — MOMETASONE FUROATE 50 MCG/ACT NA SUSP: 2.0000 | Freq: Every day | NASAL | 12 refills | Status: DC

## 2019-03-19 MED ORDER — MEDROXYPROGESTERONE ACETATE 150 MG/ML IM SUSP
150.0000 mg | Freq: Once | INTRAMUSCULAR | Status: AC
  Administered 2019-03-19: 150 mg via INTRAMUSCULAR

## 2019-03-19 MED ORDER — MEDROXYPROGESTERONE ACETATE 150 MG/ML IM SUSP: INTRAMUSCULAR | 3 refills | Status: DC

## 2019-03-19 NOTE — Progress Notes (Signed)
depo 

## 2019-03-19 NOTE — Progress Notes (Signed)
Maureen Roth 1970-11-13 [id number]    History:    Presents for breast and pelvic exam.  Wheelchair-bound, able to take a few steps with assistance/congenital hip dislocation.  Ultrasound done unable to do exam spastic cerebral palsy.  Amenorrheic on Depo-Provera.  [place].  History of a normal Pap years ago.  Has annual screening mammogram is normal.  Lives in a group home, inability to walk, dress self, needs help with bathroom and all areas of self-care.  Family is supportive and visits often.  Has annual flu vaccine.  Past medical history, past surgical history, family history and social history were all reviewed and documented in the EPIC chart.  ROS:  A ROS was performed and pertinent positives and negatives are included.  Exam: Unable to stand on a scale independently  Vitals:   03/19/19 1200  BP: 110/78   There is no height or weight on file to calculate BMI.   General appearance: Static  CP  Thyroid:  Symmetrical, normal in size, without palpable masses or nodularity. Respiratory  Auscultation:  Clear without wheezing or rhonchi Cardiovascular  Auscultation:  Regular rate, without rubs, murmurs or gallops  Edema/varicosities: Minimal mobility both feet/ankles, edematous slightly cool to touch positive pulses  Abdominal  Soft,nontender, without masses, guarding or rebound.  Liver/spleen:  No organomegaly noted  Hernia:  None appreciated  Skin  Inspection:  Grossly normal, no visible rashes   Breasts: Examined lying .     Right: Without masses, retractions, discharge or axillary adenopathy.     Left: Without masses, retractions, discharge or axillary adenopathy. Gentitourinary   Inguinal/mons:  Normal without inguinal adenopathy  External genitalia:  Normal  BUS/Urethra/Skene's glands:  Normal   Abdominal ultrasound with full bladder.  Small anteverted uterus no myometrial masses.  Thin symmetrical endometrium no thickening or masses seen 5 mm.  Both ovaries small with  atrophic appearance, small simple follicle on left side.  No adnexal masses.  No free fluid.    Assessment/Plan:  48 y.o. S WF [facility] for breast and pelvic exam with no complaints of vaginal discharge, urinary symptoms, abdominal/back pain or fever..  Amenorrheic on Depo-Provera with no menopausal symptoms Spastic cerebral palsy wheelchair-bound/minimal mobility Seasonal allergies  Plan: Menopause reviewed denies any symptoms, will continue Depo-Provera 150 IM every 12 weeks.  History of menorrhagia and dysmenorrhea with difficulty with self-care making menstrual cycles problematic.  SBEs, continue annual 3D screening mammogram, calcium rich foods, MVI daily encouraged.  Reviewed importance of continuing to work on increasing mobility, uses a stand-up walking device that helps with mobility, has an Clinical research associate.  Pap not done, unable to separate legs, reviewed normality of ultrasound to both patient and her mother.    [place], 4:15 PM 03/19/2019

## 2019-03-20 LAB — FOLLICLE STIMULATING HORMONE: FSH: 7.8 m[IU]/mL

## 2019-03-27 ENCOUNTER — Ambulatory Visit: Payer: Medicare Other | Attending: Internal Medicine

## 2019-03-27 DIAGNOSIS — Z20822 Contact with and (suspected) exposure to covid-19: Secondary | ICD-10-CM

## 2019-03-28 LAB — NOVEL CORONAVIRUS, NAA: SARS-CoV-2, NAA: NOT DETECTED

## 2019-03-31 ENCOUNTER — Telehealth: Payer: Self-pay | Admitting: General Practice

## 2019-03-31 NOTE — Telephone Encounter (Signed)
Patient's caretaker is calling to receive the patient's negative COVID test result. Patient expressed understanding.

## 2019-06-07 ENCOUNTER — Ambulatory Visit (INDEPENDENT_AMBULATORY_CARE_PROVIDER_SITE_OTHER): Payer: Medicare Other | Admitting: Gynecology

## 2019-06-07 ENCOUNTER — Other Ambulatory Visit: Payer: Self-pay

## 2019-06-07 DIAGNOSIS — Z3042 Encounter for surveillance of injectable contraceptive: Secondary | ICD-10-CM

## 2019-06-07 MED ORDER — MEDROXYPROGESTERONE ACETATE 150 MG/ML IM SUSP
150.0000 mg | Freq: Once | INTRAMUSCULAR | Status: AC
  Administered 2019-06-07: 150 mg via INTRAMUSCULAR

## 2019-07-16 ENCOUNTER — Ambulatory Visit
Admission: RE | Admit: 2019-07-16 | Discharge: 2019-07-16 | Disposition: A | Discharge status: Home / Self Care | Payer: Medicare Other | Source: Ambulatory Visit | Attending: Family Medicine | Admitting: Family Medicine

## 2019-07-16 ENCOUNTER — Other Ambulatory Visit: Payer: Self-pay

## 2019-07-16 ENCOUNTER — Other Ambulatory Visit: Payer: Self-pay | Admitting: Family Medicine

## 2019-07-16 DIAGNOSIS — R921 Mammographic calcification found on diagnostic imaging of breast: Secondary | ICD-10-CM

## 2019-09-04 ENCOUNTER — Other Ambulatory Visit: Payer: Self-pay

## 2019-09-04 ENCOUNTER — Ambulatory Visit (INDEPENDENT_AMBULATORY_CARE_PROVIDER_SITE_OTHER): Payer: Medicare Other | Admitting: Gynecology

## 2019-09-04 DIAGNOSIS — Z3042 Encounter for surveillance of injectable contraceptive: Secondary | ICD-10-CM | POA: Diagnosis not present

## 2019-09-04 MED ORDER — MEDROXYPROGESTERONE ACETATE 150 MG/ML IM SUSP
150.0000 mg | Freq: Once | INTRAMUSCULAR | Status: AC
  Administered 2019-09-04: 150 mg via INTRAMUSCULAR

## 2019-10-24 ENCOUNTER — Other Ambulatory Visit: Payer: Self-pay

## 2019-10-24 DIAGNOSIS — M7989 Other specified soft tissue disorders: Secondary | ICD-10-CM

## 2019-11-06 ENCOUNTER — Other Ambulatory Visit: Payer: Self-pay

## 2019-11-06 ENCOUNTER — Ambulatory Visit (INDEPENDENT_AMBULATORY_CARE_PROVIDER_SITE_OTHER): Payer: Medicare Other | Admitting: Vascular Surgery

## 2019-11-06 ENCOUNTER — Encounter: Payer: Self-pay | Admitting: Vascular Surgery

## 2019-11-06 ENCOUNTER — Ambulatory Visit (HOSPITAL_COMMUNITY)
Admission: RE | Admit: 2019-11-06 | Discharge: 2019-11-06 | Disposition: A | Discharge status: Home / Self Care | Payer: Medicare Other | Source: Ambulatory Visit | Attending: Vascular Surgery | Admitting: Vascular Surgery

## 2019-11-06 VITALS — BP 140/88 | HR 93 | Temp 97.0°F | Resp 18

## 2019-11-06 DIAGNOSIS — M7989 Other specified soft tissue disorders: Secondary | ICD-10-CM | POA: Insufficient documentation

## 2019-11-06 DIAGNOSIS — I872 Venous insufficiency (chronic) (peripheral): Secondary | ICD-10-CM | POA: Diagnosis not present

## 2019-11-06 NOTE — Progress Notes (Signed)
REASON FOR CONSULT:    Foot swelling, discoloration, and cool extremities.  The consult was requested by Dr. Garth Bigness.   ASSESSMENT & PLAN:   CHRONIC VENOUS DISEASE: The patient does have some superficial venous reflux bilaterally.  Certainly this may be contributing to her swelling.  However I think a lot of her swelling is related to keeping her legs dependent.  We had a long discussion about the importance of elevating her legs in the correct position for this.  Her electric chair actually can position her quite well.  I reassured her that she had no evidence of arterial insufficiency and by Doppler she has brisk biphasic signals in both feet.  I do not think any further vascular work-up is indicated.  I be happy to see her back at any time if any new vascular issues arise.  Maureen Ferrari, MD Office: [phone]   HPI:   Maureen Roth is a pleasant 49 y.o. female, with a history of cerebral palsy.  She is referred with bilateral leg swelling and some discoloration in her feet.  She has spastic quadriplegia.  She is nonambulatory.  She can stand some.  She experiences increasing swelling during the day which is better when she wakes up in the morning.  She has had no previous history of DVT.  She does not describe any significant pain associated with her swelling.  She has no history of rest pain.  She has no history for peripheral vascular disease.  Specifically she denies diabetes, hypertension, hypercholesterolemia, a family history of premature cardiovascular disease, or tobacco use.  Past Medical History:  Diagnosis Date   Allergy    Cerebral palsy (HCC)     Family History  Problem Relation Age of Onset   Hypertension Mother    Cancer Father        stomach/esophagus   Heart disease Paternal Grandfather    Breast cancer Neg Hx     SOCIAL HISTORY: Social History   Socioeconomic History   Marital status: Single    Spouse name: Not on file   Number  of children: Not on file   Years of education: Not on file   Highest education level: Not on file  Occupational History   Not on file  Tobacco Use   Smoking status: Never Smoker   Smokeless tobacco: Never Used  Vaping Use   Vaping Use: Never used  Substance and Sexual Activity   Alcohol use: No   Drug use: No   Sexual activity: Never  Other Topics Concern   Not on file  Social History Narrative   Not on file   Social Determinants of Health   Financial Resource Strain:    Difficulty of Paying Living Expenses:   Food Insecurity:    Worried About Programme researcher, broadcasting/film/video in the Last Year:    Barista in the Last Year:   Transportation Needs:    Freight forwarder (Medical):    Lack of Transportation (Non-Medical):   Physical Activity:    Days of Exercise per Week:    Minutes of Exercise per Session:   Stress:    Feeling of Stress :   Social Connections:    Frequency of Communication with Friends and Family:    Frequency of Social Gatherings with Friends and Family:    Attends Religious Services:    Active Member of Clubs or Organizations:    Attends Banker Meetings:    Marital Status:  Intimate Partner Violence:    Fear of Current or Ex-Partner:    Emotionally Abused:    Physically Abused:    Sexually Abused:     No Known Allergies  Current Outpatient Medications  Medication Sig Dispense Refill   Ascorbic Acid (VITAMIN C PO) Take by mouth.     betamethasone valerate (VALISONE) 0.1 % cream Use twice daily as needed 45 g 1   Cholecalciferol (VITAMIN D-3 PO) Take by mouth.     FIBER PO Take by mouth.     fluticasone (FLONASE) 50 MCG/ACT nasal spray PLACE 1 SPRAY INTO EACH NOSTRIL EVERY DAY 16 g 4   fluticasone (FLONASE) 50 MCG/ACT nasal spray Place 1 spray into both nostrils daily. 16 g 6   GOODSENSE NAPROXEN SODIUM 220 MG tablet TAKE 1 TABLET BY MOUTH TWICE DAILY AS NEEDED 60 tablet 6   loratadine  (CLARITIN) 10 MG tablet TAKE 1 TABLET BY MOUTH EVERY DAY AS NEEDED FOR ALLERGIES 30 tablet 12   medroxyPROGESTERone (DEPO-PROVERA) 150 MG/ML injection INJECT IM EVERY TWELVE WEEKS 1 mL 3   mometasone (NASONEX) 50 MCG/ACT nasal spray Place 2 sprays into the nose daily. 17 g 12   Naproxen Sodium 220 MG CAPS Take 1 capsule (220 mg total) by mouth every 12 (twelve) hours. 60 each 1   nystatin ointment (MYCOSTATIN) APPLY TOPICALLY TWICE DAILY (APPLY WITH TRIAMCINOLONE OINT) 60 g 4   triamcinolone ointment (KENALOG) 0.1 % APPLY TOPICALLY TO AFFECTED AREA(S) TWICE DAILY WITH NYSTATIN OINTMENT 15 g 4   No current facility-administered medications for this visit.    REVIEW OF SYSTEMS:  [X]  denotes positive finding, [ ]  denotes negative finding Cardiac  Comments:  Chest pain or chest pressure:    Shortness of breath upon exertion:    Short of breath when lying flat:    Irregular heart rhythm:        Vascular    Pain in calf, thigh, or hip brought on by ambulation:    Pain in feet at night that wakes you up from your sleep:     Blood clot in your veins:    Leg swelling:  x       Pulmonary    Oxygen at home:    Productive cough:     Wheezing:         Neurologic    Sudden weakness in arms or legs:     Sudden numbness in arms or legs:     Sudden onset of difficulty speaking or slurred speech:    Temporary loss of vision in one eye:     Problems with dizziness:         Gastrointestinal    Blood in stool:     Vomited blood:         Genitourinary    Burning when urinating:     Blood in urine:        Psychiatric    Major depression:         Hematologic    Bleeding problems:    Problems with blood clotting too easily:        Skin    Rashes or ulcers:        Constitutional    Fever or chills:     PHYSICAL EXAM:   Vitals:   11/06/19 1450  BP: 140/88  Pulse: 93  Resp: 18  Temp: (!) 97 F (36.1 C)  SpO2: 97%    GENERAL: The patient is a well-nourished female, in  no acute distress. The vital signs are documented above. CARDIAC: There is a regular rate and rhythm.  VASCULAR: I do not detect carotid bruits. I cannot palpate pedal pulses however I listen with the Doppler and she has a biphasic dorsalis pedis and posterior tibial signal bilaterally. She has swelling in both of her feet.  She has no significant leg swelling. There is some bluish discoloration when her legs are dependent but this improves with elevation of her legs.    PULMONARY: There is good air exchange bilaterally without wheezing or rales. ABDOMEN: Soft and non-tender with normal pitched bowel sounds.  MUSCULOSKELETAL: There are no major deformities or cyanosis. NEUROLOGIC: No focal weakness or paresthesias are detected. SKIN: There are no ulcers or rashes noted. PSYCHIATRIC: The patient has a normal affect.  DATA:    VENOUS DUPLEX: I did review the venous duplex scan that was done by [facility] on 09/18/2019 of both lower extremities.  This showed no evidence of DVT.  VENOUS DUPLEX: I have independently interpreted the venous duplex scan done in our office today.  On the right side there is no evidence of DVT or superficial venous thrombosis.  There was no significant deep venous reflux.  There was only some superficial venous reflux in the distal thigh and mid calf.  The vein was not especially dilated.  There was some reflux in the small saphenous vein but again the vein was not dilated.  On the left side there was no evidence of DVT or superficial venous thrombosis.  There was no deep venous reflux.  There was reflux in the superficial system involving the small saphenous vein only.

## 2019-12-03 ENCOUNTER — Ambulatory Visit (INDEPENDENT_AMBULATORY_CARE_PROVIDER_SITE_OTHER): Payer: Medicare Other | Admitting: *Deleted

## 2019-12-03 ENCOUNTER — Other Ambulatory Visit: Payer: Self-pay

## 2019-12-03 DIAGNOSIS — Z3042 Encounter for surveillance of injectable contraceptive: Secondary | ICD-10-CM | POA: Diagnosis not present

## 2019-12-03 MED ORDER — MEDROXYPROGESTERONE ACETATE 150 MG/ML IM SUSP
150.0000 mg | Freq: Once | INTRAMUSCULAR | Status: AC
  Administered 2019-12-03: 150 mg via INTRAMUSCULAR

## 2020-01-15 ENCOUNTER — Other Ambulatory Visit: Payer: Self-pay

## 2020-01-15 ENCOUNTER — Ambulatory Visit
Admission: RE | Admit: 2020-01-15 | Discharge: 2020-01-15 | Disposition: A | Discharge status: Home / Self Care | Payer: Medicare Other | Source: Ambulatory Visit | Attending: Family Medicine | Admitting: Family Medicine

## 2020-01-15 DIAGNOSIS — R921 Mammographic calcification found on diagnostic imaging of breast: Secondary | ICD-10-CM

## 2020-02-03 ENCOUNTER — Telehealth: Payer: Self-pay | Admitting: *Deleted

## 2020-02-03 NOTE — Telephone Encounter (Signed)
Patient is verbal and not complaining of anything. Mother said she will bring in for next depo injection and monitor  and follow up as needed.

## 2020-02-03 NOTE — Telephone Encounter (Signed)
Is she verbal and able to say if she is having any vaginal irritation or itching? She had an ultrasound 10 months ago that was normal and gets one annually. We can see if the spotting improves after the Depo injection and do the annual ultrasound in December at the regular time or we can do the ultrasound early. It is their preference.

## 2020-02-03 NOTE — Telephone Encounter (Signed)
Patient mother Elease Hashimoto) called c/o patient is having spotting when wiping only after using bathroom. Patient is Wheelchair-bound, cerebral palsy, lives in group home. Mother reports about 1 week ago she noticed the spotting and noticed what she reports is white mucus mixed in with blood when wiping. Reports yesterday she noticed a odor, however is is not uncommon for patient to have vaginal odor per mother. She uses depo and next injection due week 11/16-11/30, mother reports this is the first time this has happened. Mother would like recommendations. Please advise

## 2020-03-11 ENCOUNTER — Telehealth: Payer: Self-pay

## 2020-03-11 ENCOUNTER — Other Ambulatory Visit: Payer: Self-pay

## 2020-03-11 DIAGNOSIS — N912 Amenorrhea, unspecified: Secondary | ICD-10-CM

## 2020-03-11 MED ORDER — MEDROXYPROGESTERONE ACETATE 150 MG/ML IM SUSP: INTRAMUSCULAR | 0 refills | Status: DC

## 2020-03-11 NOTE — Telephone Encounter (Signed)
Patient needs refill on Depo Provera and pharmacy told them they had contacted [place] multiple times. This is the first we are hearing of her need for a refill. I sent refill to pharmacy.  Patient is scheduled to see TW on 03/23/20 for annual exam.  (Patient with Cerebral palsey.)

## 2020-03-12 ENCOUNTER — Other Ambulatory Visit: Payer: Self-pay

## 2020-03-12 ENCOUNTER — Ambulatory Visit (INDEPENDENT_AMBULATORY_CARE_PROVIDER_SITE_OTHER): Payer: Medicare Other | Admitting: Gynecology

## 2020-03-12 DIAGNOSIS — Z3042 Encounter for surveillance of injectable contraceptive: Secondary | ICD-10-CM

## 2020-03-12 MED ORDER — MEDROXYPROGESTERONE ACETATE 150 MG/ML IM SUSP
150.0000 mg | Freq: Once | INTRAMUSCULAR | Status: AC
  Administered 2020-03-12: 150 mg via INTRAMUSCULAR

## 2020-03-23 ENCOUNTER — Other Ambulatory Visit: Payer: Medicare Other

## 2020-03-23 ENCOUNTER — Ambulatory Visit: Payer: Medicare Other | Admitting: Nurse Practitioner

## 2020-03-23 ENCOUNTER — Ambulatory Visit: Payer: Medicare Other

## 2020-03-24 ENCOUNTER — Other Ambulatory Visit: Payer: Self-pay | Admitting: Nurse Practitioner

## 2020-03-24 ENCOUNTER — Other Ambulatory Visit: Payer: Medicare Other

## 2020-03-24 ENCOUNTER — Ambulatory Visit (INDEPENDENT_AMBULATORY_CARE_PROVIDER_SITE_OTHER): Payer: Medicare Other

## 2020-03-24 ENCOUNTER — Ambulatory Visit: Payer: Medicare Other

## 2020-03-24 ENCOUNTER — Ambulatory Visit: Payer: Medicare Other | Admitting: Nurse Practitioner

## 2020-03-24 ENCOUNTER — Encounter: Payer: Self-pay | Admitting: Nurse Practitioner

## 2020-03-24 ENCOUNTER — Other Ambulatory Visit: Payer: Self-pay

## 2020-03-24 ENCOUNTER — Ambulatory Visit (INDEPENDENT_AMBULATORY_CARE_PROVIDER_SITE_OTHER): Payer: Medicare Other | Admitting: Nurse Practitioner

## 2020-03-24 VITALS — BP 115/78

## 2020-03-24 DIAGNOSIS — N912 Amenorrhea, unspecified: Secondary | ICD-10-CM

## 2020-03-24 DIAGNOSIS — Z3042 Encounter for surveillance of injectable contraceptive: Secondary | ICD-10-CM

## 2020-03-24 DIAGNOSIS — Z0000 Encounter for general adult medical examination without abnormal findings: Secondary | ICD-10-CM

## 2020-03-24 LAB — FOLLICLE STIMULATING HORMONE: FSH: 28.3 m[IU]/mL

## 2020-03-24 NOTE — Progress Notes (Signed)
   Maureen Roth Aug 10, 1970 [id number]   History:  49 y.o. G0 presents for annual exam. Amenorrheic/Depo Provera. History of menorrhagia and dysmenorrhea with difficulty with self-care making menstrual cycles problematic. Had spotting 1 month ago with wiping that stopped after Depo injection. Annual ultrasound due to physical limitations with inability to perform pelvic exam. Normal mammogram history. Virginal. Spastic cerebral palsy. Mother present during visit.   Gynecologic History No LMP recorded. Patient has had an injection.   Contraception: abstinence and Depo-Provera injections Last Pap: unable to perform due to physical limitations Last mammogram: 01/15/2020. Results were: benign 8 mm grouped calcifications in left breast  Past medical history, past surgical history, family history and social history were all reviewed and documented in the EPIC chart.  ROS:  A ROS was performed and pertinent positives and negatives are included.  Exam:  Vitals:   03/24/20 1144  BP: 115/78   There is no height or weight on file to calculate BMI.  General appearance:  Normal  Ultrasound: Anteverted uterus, overlying bowel/gas.  Atrophic, no myometrial masses.  Thin, symmetrical endometrium 3.85 mm, no masses or thickening seen.  Both ovaries atrophic.  No adnexal masses, no free fluid.  Assessment/Plan:  49 y.o. G0 for annual exam.   Well female exam without gynecological exam - Education provided on SBEs, importance of preventative screenings, current guidelines, high calcium diet, regular exercise, and multivitamin daily. Labs with PCP. Discussed normality of ultrasound with patient and mother.   Surveillance for Depo-Provera contraception - Facility administers every 90 days. History of menorrhagia and dysmenorrhea with difficulty with self-care making menstrual cycles problematic.  Amenorrhea due to Depo Provera - Plan: Follicle stimulating hormone. Denies menopausal symptoms.    Screening for cervical cancer - has never had pap smear due to physical limitations of spastic cerebral palsy.  Screening for breast cancer - Normal mammogram history. Continue annual screenings.  Follow up in 1 year for annual/ultrasound.       Olivia Mackie [facility], 1:53 PM 03/24/2020

## 2020-03-24 NOTE — Patient Instructions (Signed)
Health Maintenance, Female Adopting a healthy lifestyle and getting preventive care are important in promoting health and wellness. Ask your health care provider about:  The right schedule for you to have regular tests and exams.  Things you can do on your own to prevent diseases and keep yourself healthy. What should I know about diet, weight, and exercise? Eat a healthy diet   Eat a diet that includes plenty of vegetables, fruits, low-fat dairy products, and lean protein.  Do not eat a lot of foods that are high in solid fats, added sugars, or sodium. Maintain a healthy weight Body mass index (BMI) is used to identify weight problems. It estimates body fat based on height and weight. Your health care provider can help determine your BMI and help you achieve or maintain a healthy weight. Get regular exercise Get regular exercise. This is one of the most important things you can do for your health. Most adults should:  Exercise for at least 150 minutes each week. The exercise should increase your heart rate and make you sweat (moderate-intensity exercise).  Do strengthening exercises at least twice a week. This is in addition to the moderate-intensity exercise.  Spend less time sitting. Even light physical activity can be beneficial. Watch cholesterol and blood lipids Have your blood tested for lipids and cholesterol at 50 years of age, then have this test every 5 years. Have your cholesterol levels checked more often if:  Your lipid or cholesterol levels are high.  You are older than 50 years of age.  You are at high risk for heart disease. What should I know about cancer screening? Depending on your health history and family history, you may need to have cancer screening at various ages. This may include screening for:  Breast cancer.  Cervical cancer.  Colorectal cancer.  Skin cancer.  Lung cancer. What should I know about heart disease, diabetes, and high blood  pressure? Blood pressure and heart disease  High blood pressure causes heart disease and increases the risk of stroke. This is more likely to develop in people who have high blood pressure readings, are of African descent, or are overweight.  Have your blood pressure checked: ? Every 3-5 years if you are 18-39 years of age. ? Every year if you are 40 years old or older. Diabetes Have regular diabetes screenings. This checks your fasting blood sugar level. Have the screening done:  Once every three years after age 40 if you are at a normal weight and have a low risk for diabetes.  More often and at a younger age if you are overweight or have a high risk for diabetes. What should I know about preventing infection? Hepatitis B If you have a higher risk for hepatitis B, you should be screened for this virus. Talk with your health care provider to find out if you are at risk for hepatitis B infection. Hepatitis C Testing is recommended for:  Everyone born from 1945 through 1965.  Anyone with known risk factors for hepatitis C. Sexually transmitted infections (STIs)  Get screened for STIs, including gonorrhea and chlamydia, if: ? You are sexually active and are younger than 50 years of age. ? You are older than 50 years of age and your health care provider tells you that you are at risk for this type of infection. ? Your sexual activity has changed since you were last screened, and you are at increased risk for chlamydia or gonorrhea. Ask your health care provider if   you are at risk.  Ask your health care provider about whether you are at high risk for HIV. Your health care provider may recommend a prescription medicine to help prevent HIV infection. If you choose to take medicine to prevent HIV, you should first get tested for HIV. You should then be tested every 3 months for as long as you are taking the medicine. Pregnancy  If you are about to stop having your period (premenopausal) and  you may become pregnant, seek counseling before you get pregnant.  Take 400 to 800 micrograms (mcg) of folic acid every day if you become pregnant.  Ask for birth control (contraception) if you want to prevent pregnancy. Osteoporosis and menopause Osteoporosis is a disease in which the bones lose minerals and strength with aging. This can result in bone fractures. If you are 65 years old or older, or if you are at risk for osteoporosis and fractures, ask your health care provider if you should:  Be screened for bone loss.  Take a calcium or vitamin D supplement to lower your risk of fractures.  Be given hormone replacement therapy (HRT) to treat symptoms of menopause. Follow these instructions at home: Lifestyle  Do not use any products that contain nicotine or tobacco, such as cigarettes, e-cigarettes, and chewing tobacco. If you need help quitting, ask your health care provider.  Do not use street drugs.  Do not share needles.  Ask your health care provider for help if you need support or information about quitting drugs. Alcohol use  Do not drink alcohol if: ? Your health care provider tells you not to drink. ? You are pregnant, may be pregnant, or are planning to become pregnant.  If you drink alcohol: ? Limit how much you use to 0-1 drink a day. ? Limit intake if you are breastfeeding.  Be aware of how much alcohol is in your drink. In the [place] one drink equals one 12 oz bottle of beer (355 mL), one 5 oz glass of wine (148 mL), or one 1 oz glass of hard liquor (44 mL). General instructions  Schedule regular health, dental, and eye exams.  Stay current with your vaccines.  Tell your health care provider if: ? You often feel depressed. ? You have ever been abused or do not feel safe at home. Summary  Adopting a healthy lifestyle and getting preventive care are important in promoting health and wellness.  Follow your health care provider's instructions about healthy  diet, exercising, and getting tested or screened for diseases.  Follow your health care provider's instructions on monitoring your cholesterol and blood pressure. This information is not intended to replace advice given to you by your health care provider. Make sure you discuss any questions you have with your health care provider. Document Revised: 03/14/2018 Document Reviewed: 03/14/2018 Elsevier Patient Education  2020 [employer]  

## 2020-05-07 DIAGNOSIS — H25042 Posterior subcapsular polar age-related cataract, left eye: Secondary | ICD-10-CM | POA: Diagnosis not present

## 2020-05-07 DIAGNOSIS — H268 Other specified cataract: Secondary | ICD-10-CM | POA: Diagnosis not present

## 2020-05-28 ENCOUNTER — Other Ambulatory Visit: Payer: Self-pay | Admitting: Nurse Practitioner

## 2020-05-28 DIAGNOSIS — N912 Amenorrhea, unspecified: Secondary | ICD-10-CM

## 2020-05-28 NOTE — Telephone Encounter (Signed)
Medication refill request: depo-provera Last AEX:  03-24-20 TW Next AEX: 03-25-21 Last MMG (if hormonal medication request): 01-15-20 density C/BIRADS 3 probably benign  Refill authorized: Today, please advise.   Medication pended for #1, 0RF. Please refill if appropriate.

## 2020-06-03 DIAGNOSIS — Z961 Presence of intraocular lens: Secondary | ICD-10-CM | POA: Diagnosis not present

## 2020-06-03 DIAGNOSIS — H26492 Other secondary cataract, left eye: Secondary | ICD-10-CM | POA: Diagnosis not present

## 2020-06-15 ENCOUNTER — Other Ambulatory Visit: Payer: Self-pay

## 2020-06-15 ENCOUNTER — Ambulatory Visit (INDEPENDENT_AMBULATORY_CARE_PROVIDER_SITE_OTHER): Payer: Medicare Other | Admitting: *Deleted

## 2020-06-15 DIAGNOSIS — Z3042 Encounter for surveillance of injectable contraceptive: Secondary | ICD-10-CM | POA: Diagnosis not present

## 2020-06-15 MED ORDER — MEDROXYPROGESTERONE ACETATE 150 MG/ML IM SUSP
150.0000 mg | Freq: Once | INTRAMUSCULAR | Status: AC
  Administered 2020-06-15: 150 mg via INTRAMUSCULAR

## 2020-07-01 DIAGNOSIS — G809 Cerebral palsy, unspecified: Secondary | ICD-10-CM | POA: Diagnosis not present

## 2020-07-01 DIAGNOSIS — H6123 Impacted cerumen, bilateral: Secondary | ICD-10-CM | POA: Diagnosis not present

## 2020-07-13 DIAGNOSIS — F819 Developmental disorder of scholastic skills, unspecified: Secondary | ICD-10-CM | POA: Diagnosis not present

## 2020-07-20 DIAGNOSIS — M92211 Osteochondrosis (juvenile) of carpal lunate [Kienbock], right hand: Secondary | ICD-10-CM | POA: Diagnosis not present

## 2020-07-24 ENCOUNTER — Encounter: Payer: Self-pay | Admitting: Psychology

## 2020-09-01 ENCOUNTER — Other Ambulatory Visit: Payer: Self-pay | Admitting: Nurse Practitioner

## 2020-09-01 DIAGNOSIS — N912 Amenorrhea, unspecified: Secondary | ICD-10-CM

## 2020-09-01 NOTE — Telephone Encounter (Signed)
Medication refill request: Depo-provera  Last AEX:  03-24-20 TW Next AEX: 03-25-21  Last MMG (if hormonal medication request): 01-15-20 density C/BIRADS 3 probably benign  Refill authorized: Today, please advise.   Medication pended for #1, 0RF. Please refill if appropriate.

## 2020-09-04 ENCOUNTER — Other Ambulatory Visit: Payer: Self-pay

## 2020-09-04 ENCOUNTER — Ambulatory Visit (INDEPENDENT_AMBULATORY_CARE_PROVIDER_SITE_OTHER): Payer: Medicare Other | Admitting: *Deleted

## 2020-09-04 DIAGNOSIS — Z3042 Encounter for surveillance of injectable contraceptive: Secondary | ICD-10-CM | POA: Diagnosis not present

## 2020-09-04 MED ORDER — MEDROXYPROGESTERONE ACETATE 150 MG/ML IM SUSP
150.0000 mg | Freq: Once | INTRAMUSCULAR | Status: AC
  Administered 2020-09-04: 150 mg via INTRAMUSCULAR

## 2020-09-21 DIAGNOSIS — M25511 Pain in right shoulder: Secondary | ICD-10-CM | POA: Diagnosis not present

## 2020-09-21 DIAGNOSIS — R29898 Other symptoms and signs involving the musculoskeletal system: Secondary | ICD-10-CM | POA: Diagnosis not present

## 2020-10-12 DIAGNOSIS — M92211 Osteochondrosis (juvenile) of carpal lunate [Kienbock], right hand: Secondary | ICD-10-CM | POA: Diagnosis not present

## 2020-10-20 ENCOUNTER — Ambulatory Visit (INDEPENDENT_AMBULATORY_CARE_PROVIDER_SITE_OTHER): Payer: Medicare Other | Admitting: Psychology

## 2020-10-20 ENCOUNTER — Other Ambulatory Visit: Payer: Self-pay

## 2020-10-20 ENCOUNTER — Ambulatory Visit: Payer: Medicare Other | Admitting: Psychology

## 2020-10-20 ENCOUNTER — Encounter: Payer: Self-pay | Admitting: Psychology

## 2020-10-20 DIAGNOSIS — F79 Unspecified intellectual disabilities: Secondary | ICD-10-CM | POA: Diagnosis not present

## 2020-10-20 DIAGNOSIS — G801 Spastic diplegic cerebral palsy: Secondary | ICD-10-CM | POA: Diagnosis not present

## 2020-10-20 DIAGNOSIS — R4189 Other symptoms and signs involving cognitive functions and awareness: Secondary | ICD-10-CM

## 2020-10-20 DIAGNOSIS — Z87898 Personal history of other specified conditions: Secondary | ICD-10-CM | POA: Insufficient documentation

## 2020-10-20 HISTORY — DX: Unspecified intellectual disabilities: F79

## 2020-10-20 NOTE — Progress Notes (Signed)
NEUROPSYCHOLOGICAL EVALUATION [place]. [facility] [place] Department of Neurology  Date of Evaluation: October 20, 2020  Reason for Referral:   Maureen Roth is a 50 y.o. right-handed Caucasian female referred by  Garth Bigness, M.D. , to characterize her current cognitive functioning and assist with diagnostic clarity and treatment planning in the context of spastic cerebral palsy, concerns for an intellectual disability, and some questions regarding competency to make informed medical decisions.   Assessment and Plan:   Clinical Impression(s): Maureen Roth pattern of performance is suggestive of diffuse cognitive impairment, often in the exceptionally low to well below average normative ranges relative to age-matched peers. She did demonstrate a relative strength across basic attention, as well as recognition/consolidation aspects of verbal memory. Some variability was also exhibited across working memory. However, performances were notably impaired across processing speed, executive functioning, safety/judgment, receptive and expressive language, visuospatial abilities, and both encoding (i.e., learning) and retrieval aspects of memory. Maureen Roth resides at a group home and has full assistance with medication and financial management. She also does not drive, suggesting additional dysfunction surrounding the performance of instrumental activities of daily living.   Regarding intellectual functioning, a typical full scale IQ was unable to be obtained due to significant motor limitations giver her history of cerebral palsy. However, a general abilities index (GAI), which represents an IQ calculation without the contribution of working memory or processing speed domains, scored in the exceptionally low normative range (GAI: 54, <1st percentile). Given IQ performances, coupled with deficits in adaptive functioning, Maureen Roth meets criteria for an Intellectual Disability, mild  severity, based upon current testing. The cause for her intellectual disability is most likely her history of cerebral palsy as significant cognitive impairment and intellectual dysfunction is common in this presentation, especially in those classified as having spastic quadriplegic cerebral palsy.   Regarding concerns surrounding competency, Maureen Roth scored in the exceptionally low (<1st percentile) normative range relative to age-matched peers on a task assessing safety and judgment (NAB Judgment). While her answers across this assessment were not inherently unsafe and in many instances relevant to the questions asked, they were often vague and void of more expansive details or provided explanations. However, there were several questions which appeared to confuse her and she was unable to answer. On a second task used to assess an individual's ability to understand complex questions and formulate appropriate answers (WAIS-IV Comprehension), she also performed in the exceptionally low (<1st percentile) range relative to age-matched peers. She demonstrated notable difficulty understanding questions/scenarios and was generally unable to provide adequate responses across this measure. Overall, I do have concerns for her competency to make informed medical decisions, especially as these decisions become more complex and require the consideration of multiple options. I do agree with Dr. Chanetta Marshall that [name] is capable of making decisions around personal choices like what she wants to wear or how she cuts her hair. However, I feel it would be prudent that, at the very least, she make more important medical decisions with others present who can be fully knowledgeable about all decision aspects. Guardianship and medical power of attorney should be considered by her and her caregivers if not already done.   Recommendations: I believe that Maureen Roth's current living situation is quite appropriate for her given the  extent of her deficits. She will continue to require assistance with financial and medication management throughout her life.  As stated above and if not already done, Maureen Roth and her family/caregivers  should discuss durable power of attorney and medical decision making so that she can have input into these choices. The pursuance of a legal guardian would also be worth considering. If they need legal assistance with this, I would recommend they contact Silvano Rusk, J.D., at the [facility] ([email or web address] or [phone]).   Maureen Roth did not report significant levels of anxiety or depression, both during interview or across mood-related questionnaires. Should she express mood concerns in the future, she and her family may wish to consult with her PCP about potential medication adjustments or additions. Individual psychotherapy could also be considered if necessary.   When learning new information, she would benefit from information being broken up into small, manageable pieces. She may also find it helpful to articulate the material in her own words and in a context to promote encoding at the onset of a new task. This material may need to be repeated multiple times to promote encoding. Importantly, Maureen Roth did not demonstrate a deficit with information storage across testing. What gets in does seem to stick, she just has difficulty taking in this information initially and then later providing it on demand.   To address problems with processing speed, she may wish to consider:   -Ensuring that she is alerted when essential material or instructions are being presented   -Adjusting the speed at which new information is presented   -Allowing for more time in comprehending, processing, and responding in conversation  To address problems with fluctuating attention, she may wish to consider:   -Avoiding external distractions when needing to concentrate   -Limiting exposure  to fast paced environments with multiple sensory demands   -Writing down complicated information and using checklists   -Attempting and completing one task at a time (i.e., no multi-tasking)   -Verbalizing aloud each step of a task to maintain focus   -Reducing the amount of information considered at one time  Review of Records:   A referral was placed by her PCP Garth Bigness, M.D.) on 07/24/2020 requesting a neuropsychological evaluation. The referral noted that there were concerns surrounding an intellectual disability in childhood and that Ms. Skilton and her caregivers were interested in formal IQ testing. There were also questions about her competency to make informed medical decisions. Per medical records, Dr. Chanetta Marshall did state "From my time with patient, I think she does have competency around items such as deciding if she wants vaccines, starting counseling, and certainly personal items like what she wants to wear or how she cuts her hair." Ultimately, Ms. Pennings was referred for a comprehensive neuropsychological evaluation to characterize her cognitive abilities and to assist with diagnostic clarity and treatment planning.   Ms. Breece was seen by her orthopedist Cindee Salt, M.D.) on 10/12/2020 for symptoms of pain and trembling in her right arm when lifting items. During this appointment, Ms. Abplanalp reported increased weakness and tremor spasticity on her right side. She had been on Flexeril but this medication apparently ran out. Dr. Merlyn Lot noted that Ms. Hagerty previously saw Dr. Madelon Lips for neurology but has not been followed since Dr. Candise Bowens retirement. As such, he placed a referral with [facility] for treatment of these conditions. No status update on this referral was available at the time of the current evaluation.   No neuroimaging was available for review.   Past Medical History:  Diagnosis Date   History of seizures    Osteochondrosis of lunate of right wrist  07/18/2018   Pain 12/22/2017  Seasonal allergies 11/27/2013   Spastic cerebral palsy    Congenital hip dislocation Motorized wheelchair Lives in a group home    Past Surgical History:  Procedure Laterality Date   adnoidectomy      Current Outpatient Medications:    Ascorbic Acid (VITAMIN C PO), Take by mouth., Disp: , Rfl:    betamethasone valerate (VALISONE) 0.1 % cream, Use twice daily as needed, Disp: 45 g, Rfl: 1   Cholecalciferol (VITAMIN D-3 PO), Take by mouth., Disp: , Rfl:    FIBER PO, Take by mouth., Disp: , Rfl:    fluticasone (FLONASE) 50 MCG/ACT nasal spray, PLACE 1 SPRAY INTO EACH NOSTRIL EVERY DAY, Disp: 16 g, Rfl: 4   fluticasone (FLONASE) 50 MCG/ACT nasal spray, Place 1 spray into both nostrils daily., Disp: 16 g, Rfl: 6   GOODSENSE NAPROXEN SODIUM 220 MG tablet, TAKE 1 TABLET BY MOUTH TWICE DAILY AS NEEDED, Disp: 60 tablet, Rfl: 6   loratadine (CLARITIN) 10 MG tablet, TAKE 1 TABLET BY MOUTH EVERY DAY AS NEEDED FOR ALLERGIES, Disp: 30 tablet, Rfl: 12   medroxyPROGESTERone (DEPO-PROVERA) 150 MG/ML injection, INJECT (150MG ) IM EVERY TWELVE WEEKS FOR AMENORRHEA, Disp: 1 mL, Rfl: 0   mometasone (NASONEX) 50 MCG/ACT nasal spray, Place 2 sprays into the nose daily., Disp: 17 g, Rfl: 12   Naproxen Sodium 220 MG CAPS, Take 1 capsule (220 mg total) by mouth every 12 (twelve) hours. (Patient not taking: Reported on 03/24/2020), Disp: 60 each, Rfl: 1   nystatin ointment (MYCOSTATIN), APPLY TOPICALLY TWICE DAILY (APPLY WITH TRIAMCINOLONE OINT), Disp: 60 g, Rfl: 4   triamcinolone ointment (KENALOG) 0.1 %, APPLY TOPICALLY TO AFFECTED AREA(S) TWICE DAILY WITH NYSTATIN OINTMENT, Disp: 15 g, Rfl: 4  Clinical Interview:   The following information was obtained during a clinical interview with Ms. Fiorenza and her parents prior to cognitive testing.  Cognitive Symptoms: Decreased short-term memory: Denied. Decreased long-term memory: Denied. Decreased attention/concentration: Endorsed.  Difficulties were largely said to be situation or environment dependant. Both Ms. Botts and her mother provided an example of her having to be moved at work because a co-worker was talking loudly and being distracting. In general, Ms. Thorson stated that she is able to concentrate and attend when necessary.  Reduced processing speed: Endorsed. Difficulties with executive functions: Endorsed. Her mother alluded to some difficulty making decisions. Ms. Mccarrick noted that she has been trying hard to stay on top of everything and feels that she is doing a reasonably well job. Difficulties with emotion regulation: Denied. Difficulties with receptive language: Denied. Difficulties with word finding: Endorsed. Speech was effortful and word finding difficulties are notable while conversing with others.  Decreased visuoperceptual ability: Denied.  Trajectory of deficits: Difficulties are longstanding in nature aligning with her diagnosis of spastic cerebral palsy. As stated above, there have been concerns surrounding an intellectual disability since childhood.   Difficulties completing ADLs: Endorsed. Ms. Heikkila currently resides at a group home where they manage medications. Her parents and the group home help manage personal finances. She does not drive.   Additional Medical History: History of traumatic brain injury/concussion: Unclear. Her mother noted an instance where Ms. Obyrne fell out of her chair, ultimately hitting her head on the ground. It was unclear if she sustained a concussion from this fall. Loss of consciousness was not reported. No other potential head injuries were described.  History of stroke: Denied. History of seizure activity: Endorsed. Remote frequent seizure activity was reported during childhood. These symptoms appeared  to dissipate on their own. Ms. Ivanoff and her mother noted that seizure activity had not been present for a very long time.  History of known exposure to toxins:  Denied. Symptoms of chronic pain: Denied. However, she did report that recurrent spasticity can be extremely painful.  Experience of frequent headaches/migraines: Denied. Frequent instances of dizziness/vertigo: Denied.  Sensory changes: She wears glasses with positive effect. Other sensory changes/difficulties (e.g., hearing, taste, or smell) were denied.  Balance/coordination difficulties: N/A - Ms. Streng ambulates via an Passenger transport manager.  Other motor difficulties: Endorsed. She reported ongoing spasticity and tremulous symptoms throughout her body, particularly impacting her right arm/hand. She takes Flexeril and has been referred to a neurologist for ongoing treatment.    Sleep History: Estimated hours obtained each night: 6-8 hours.  Difficulties falling asleep: Denied. However, medications were said to be helpful at allowing her to fall asleep quickly.  Difficulties staying asleep: Denied. Feels rested and refreshed upon awakening: Variably so, depending on the quantity and quality of sleep she is able to obtain the night before.   History of snoring: Denied. History of waking up gasping for air: Denied. Witnessed breath cessation while asleep: Denied.  History of vivid dreaming: Denied. Excessive movement while asleep: Denied. Instances of acting out her dreams: Denied.  Psychiatric/Behavioral Health History: Depression: She described her current mood as "okay." She did acknowledge periods of time where she can become irritable. This was particularly troublesome during earlier portions of the ongoing COVID-19 pandemic where there were significant restrictions in her group home. She described appropriate reactions when feeling irritated by others and generally will retreat to her room and relax. Current or remote suicidal ideation, intent, or plan was denied. Her mother stated that the group home had expressed concerns surrounding mild depression. However, her parents did not report  concerns.  Anxiety: Denied. Mania: Denied. Trauma History: Denied. Visual/auditory hallucinations: Denied. Delusional thoughts: Denied.  Tobacco: Denied. Alcohol: She denied current alcohol consumption as well as a history of problematic alcohol abuse or dependence.  Recreational drugs: Denied.  Family History: Problem Relation Age of Onset   Hypertension Mother    Cancer Father        stomach/esophagus   Heart disease Paternal Grandfather    Muscular dystrophy Paternal Grandfather    Breast cancer Neg Hx    This information was confirmed by Ms. Freshour and her family.  Academic/Vocational History: Highest level of educational attainment: 12 years. Ms. Linehan attended high school and aged out when she turned 57. She reported being able to perform everything she was asked to do while in high school but did not report earning any formal grades.  History of developmental delay: Endorsed. As stated above, there have been longstanding concerns surrounding an intellectual disability.  History of grade repetition: Denied. History of LD/ADHD: Denied.  Employment: Ms. Colon currently works for [employer], starting there this past May. Job responsibilities include typing and shredding papers. She also mentioned that she may become trained to answer phones in the future. She denied any cognitive-related problems with work International aid/development worker.   Evaluation Results:   Behavioral Observations: Ms. Weyland was accompanied by her parents, arrived to her appointment on time, and was appropriately dressed and groomed. She appeared alert and oriented. She ambulated via an electric wheelchair and maneuvered this device well. Gross motor functioning appeared intact upon informal observation and no abnormal movements (e.g., tremors) were noted; however, she generally kept her hands folded in her lap during interview. Her  affect was relaxed and positive. Spontaneous speech was halting and seemed quite  effortful at times. There also appeared to be a few instances where word finding difficulties led to frustration and she would stop speaking mid-sentence. Thought processes were generally coherent, organized, and normal in content. Insight into her cognitive difficulties appeared limited in that dysfunction was often far greater than what was reported by Ms. Hagwood during interview.   During testing, motorized tasks were not attempted given her history of cerebral palsy and difficulty with the use of her upper extremities. Tasks were substituted with verbal alternatives where appropriate. Sustained attention was appropriate. Task engagement was adequate and she persisted when challenged. A resting tremor was observed at times. She had significant difficulty understanding task instructions and often required numerous instruction repetition and clarification. In several cases, she was still unable to comprehend certain cognitive tests. She did appear to fatigue as testing progressed. The evaluation was shortened in response. Overall, Ms. Joslin was cooperative with the clinical interview and subsequent testing procedures.   Adequacy of Effort: The validity of neuropsychological testing is limited by the extent to which the individual being tested may be assumed to have exerted adequate effort during testing. Ms. Ehler expressed her intention to perform to the best of her abilities and exhibited adequate task engagement and persistence. Scores across stand-alone and embedded performance validity measures were within expectation. As such, the results of the current evaluation are believed to be a valid representation of Ms. Peregrina's current cognitive functioning.  Test Results: Ms. Peto was largely oriented at the time of the current evaluation. Points were lost for her being unable to name the current clinic.  Intellectual abilities based upon educational and vocational attainment were estimated to be in the  well below average range. While a full scale IQ was unable to be obtained due to motor limitations, her WAIS-IV general abilities index (GAI) scored in the exceptionally low range (GAI: 54, 1st percentile) relative to age-matched peers.   Assessed processing speed was exceptionally low to well below average. Basic attention was average to above average. More complex attention (e.g., working memory) was variable, ranging from the exceptionally low to below average normative ranges. Assessed executive functioning (i.e., cognitive flexibility and abstract reasoning) was exceptionally low to well below average. She also performed in the exceptionally low range across a task assessing safety and judgment.  While not directly assessed, receptive language abilities were believed to be impaired as Ms. Legner exhibited significant difficulties comprehending task instructions during testing. Assessed expressive language (e.g., verbal fluency and confrontation naming) was exceptionally low.     Assessed visuospatial abilities were exceptionally low to well below average. Visuoconstructional abilities were unable to be assessed due to motor limitations.    Learning (i.e., encoding) of novel verbal information was exceptionally low. Spontaneous delayed recall (i.e., retrieval) of previously learned information was also exceptionally low. Retention rates were 75% (raw score of 3) across a story learning task and 0% across a list learning task. Performance across recognition tasks was below average to average, suggesting evidence for information consolidation.   Results of emotional screening instruments suggested that recent symptoms of generalized anxiety were in the minimal range, while symptoms of depression were within normal limits. A screening instrument assessing recent sleep quality suggested the presence of minimal sleep dysfunction.  Tables of Scores:   Note: This summary of test scores accompanies the  interpretive report and should not be considered in isolation without reference to the appropriate sections  in the text. Descriptors are based on appropriate normative data and may be adjusted based on clinical judgment. Terms such as "Within Normal Limits" and "Outside Normal Limits" are used when a more specific description of the test score cannot be determined.       Percentile - Normative Descriptor > 98 - Exceptionally High 91-97 - Well Above Average 75-90 - Above Average 25-74 - Average 9-24 - Below Average 2-8 - Well Below Average < 2 - Exceptionally Low       Validity:   DESCRIPTOR       RBANS Effort Index: --- --- Within Normal Limits  WAIS-IV Reliable Digit Span: --- --- Within Normal Limits       Orientation:      Raw Score Percentile   NAB Orientation, Form 1 27/29 --- ---       Cognitive Screening:     RBANS, Form A: Standard Score/ Scaled Score Percentile   Total Score --- --- ---  Immediate Memory 49 <1 Exceptionally Low    List Learning 1 <1 Exceptionally Low    Story Memory 3 1 Exceptionally Low  Visuospatial/Constructional --- --- ---    Figure Copy --- --- ---    Line Orientation 1/20 <2 Exceptionally Low  Language 51 <1 Exceptionally Low    Picture Naming 6/10 <2 Exceptionally Low    Semantic Fluency 2 <1 Exceptionally Low  Attention --- --- ---    Digit Span 12 75 Above Average    Coding --- --- ---  Delayed Memory --- --- ---    List Recall 0/10 <2 Exceptionally Low    List Recognition 19/20 26-50 Average    Story Recall 3 1 Exceptionally Low    Story Recognition 9/12 16-26 Below Average    Figure Recall --- --- ---    Figure Recognition --- --- ---       Intellectual Functioning:     Wechsler Adult Intelligence Scale (WAIS-IV):  Standard Score/ Scaled Score Percentile   Full Scale IQ  --- --- ---  GAI 54 <1 Exceptionally Low  Verbal Comprehension Index: 58 <1 Exceptionally Low    Similarities  3 1 Exceptionally Low    Vocabulary --- --- ---     Information  4 2 Well Below Average    Comprehension 2 <1 Exceptionally Low  Perceptual Reasoning Index:  56 <1 Exceptionally Low    Block Design  --- --- ---    Matrix Reasoning  5 5 Well Below Average    Visual Puzzles 1 <1 Exceptionally Low    Figure Weights 2 <1 Exceptionally Low    Picture Completion 1 <1 Exceptionally Low  Working Memory Index: 66 1 Exceptionally Low    Digit Span 6 9 Below Average    Arithmetic  2 <1 Exceptionally Low  Processing Speed Index: --- --- ---    Symbol Search  --- --- ---    Coding --- --- ---       Attention/Executive Function:     Oral Trail Making Test (OTMT): Raw Score (Z-Score) Percentile     Part A 26 secs.,  0 errors (-8.3) <1 Exceptionally Low    Part B Discontinued --- Impaired       Symbol Digit Modalities Test (SDMT): Raw Score (Z-Score) Percentile     Oral 0 (-1.8) 4 Well Below Average        Scaled Score Percentile   WAIS-IV Digit Span: 6 9 Below Average    Forward 11 63 Average  Backward 7 16 Below Average    Sequencing 3 1 Exceptionally Low       NAB Executive Functions Module, Form 1: T Score Percentile     Judgment 23 <1 Exceptionally Low       Language:     Verbal Fluency Test: Raw Score (T Score) Percentile     Phonemic Fluency (FAS) 11 (19) <1 Exceptionally Low    Animal Fluency 5 (10) <1 Exceptionally Low        Mood and Personality:      Raw Score Percentile   PROMIS Depression Questionnaire: 15 --- None to Slight  PROMIS Anxiety Questionnaire: 10 --- None to Slight       Additional Questionnaires:      Raw Score Percentile   PROMIS Sleep Disturbance Questionnaire: 20 --- None to Slight   Informed Consent and Coding/Compliance:   The current evaluation represents a clinical evaluation for the purposes previously outlined by the referral source and is in no way reflective of a forensic evaluation.   Ms. Katrinka BlazingSmith was provided with a verbal description of the nature and purpose of the present  neuropsychological evaluation. Also reviewed were the foreseeable risks and/or discomforts and benefits of the procedure, limits of confidentiality, and mandatory reporting requirements of this provider. The patient was given the opportunity to ask questions and receive answers about the evaluation. Oral consent to participate was provided by the patient.   This evaluation was conducted by Newman NickelsZachary C. Nolin Grell, Ph.D., licensed clinical neuropsychologist. Ms. Katrinka BlazingSmith completed a clinical interview with Dr. Milbert CoulterMerz, billed as one unit [id number], and 125 minutes of cognitive testing and scoring, billed as one unit [id number] and three additional units [id number]. Psychometrist Wallace Kellerana Chamberlain, B.S., assisted Dr. Milbert CoulterMerz with test administration and scoring procedures. As a separate and discrete service, Dr. Milbert CoulterMerz spent a total of 170 minutes in interpretation and report writing billed as one unit [id number] and two units [id number].

## 2020-10-20 NOTE — Progress Notes (Signed)
   Psychometrician Note   Cognitive testing was administered to [name] by Wallace Keller, B.S. (psychometrist) under the supervision of Dr. Newman Nickels, Ph.D., licensed psychologist on 10/20/20. Ms. Mone did not appear overtly distressed by the testing session per behavioral observation or responses across self-report questionnaires. Rest breaks were offered.    The battery of tests administered was selected by Dr. Newman Nickels, Ph.D. with consideration to Ms. Hughley's current level of functioning, the nature of her symptoms, emotional and behavioral responses during interview, level of literacy, observed level of motivation/effort, and the nature of the referral question. This battery was communicated to the psychometrist. Communication between Dr. Newman Nickels, Ph.D. and the psychometrist was ongoing throughout the evaluation and Dr. Newman Nickels, Ph.D. was immediately accessible at all times. Dr. Newman Nickels, Ph.D. provided supervision to the psychometrist on the date of this service to the extent necessary to assure the quality of all services provided.    ICEIS KNAB will return within approximately 1-2 weeks for an interactive feedback session with Dr. Milbert Coulter at which time her test performances, clinical impressions, and treatment recommendations will be reviewed in detail. Ms. Colvin understands she can contact our office should she require our assistance before this time.  A total of 125 minutes of billable time were spent face-to-face with Ms. Hislop by the psychometrist. This includes both test administration and scoring time. Billing for these services is reflected in the clinical report generated by Dr. Newman Nickels, Ph.D.  This note reflects time spent with the psychometrician and does not include test scores or any clinical interpretations made by Dr. Milbert Coulter. The full report will follow in a separate note.

## 2020-10-21 ENCOUNTER — Encounter: Payer: Self-pay | Admitting: Psychology

## 2020-10-23 DIAGNOSIS — M92211 Osteochondrosis (juvenile) of carpal lunate [Kienbock], right hand: Secondary | ICD-10-CM | POA: Diagnosis not present

## 2020-10-28 ENCOUNTER — Ambulatory Visit (INDEPENDENT_AMBULATORY_CARE_PROVIDER_SITE_OTHER): Payer: Medicare Other | Admitting: Psychology

## 2020-10-28 ENCOUNTER — Other Ambulatory Visit: Payer: Self-pay

## 2020-10-28 DIAGNOSIS — G801 Spastic diplegic cerebral palsy: Secondary | ICD-10-CM

## 2020-10-28 DIAGNOSIS — F79 Unspecified intellectual disabilities: Secondary | ICD-10-CM

## 2020-10-28 NOTE — Progress Notes (Signed)
   Neuropsychology Feedback Session Maureen Roth. [facility] [place] Department of Neurology  Reason for Referral:   Maureen Roth is a 50 y.o. right-handed Caucasian female referred by  Garth Bigness, M.D. , to characterize her current cognitive functioning and assist with diagnostic clarity and treatment planning in the context of spastic cerebral palsy, concerns for an intellectual disability, and some questions regarding competency to make informed medical decisions.   Feedback:   [name] completed a comprehensive neuropsychological evaluation on 10/20/2020. Please refer to that encounter for the full report and recommendations. Briefly, results suggested diffuse cognitive impairment, often in the exceptionally low to well below average normative ranges relative to age-matched peers. She did demonstrate a relative strength across basic attention, as well as recognition/consolidation aspects of verbal memory. Some variability was also exhibited across working memory. However, performances were notably impaired across processing speed, executive functioning, safety/judgment, receptive and expressive language, visuospatial abilities, and both encoding (i.e., learning) and retrieval aspects of memory. a general abilities index (GAI), which represents an IQ calculation without the contribution of working memory or processing speed domains, scored in the exceptionally low normative range (GAI: 54, <1st percentile). Given IQ performances, coupled with deficits in adaptive functioning, [name] meets criteria for an Intellectual Disability, mild severity, based upon current testing.  [name] was accompanied during the current feedback session by a caregiver from her group home. Her parents were also present on the telephone via speakerphone. Content of the current session focused on the results of her neuropsychological evaluation. [name] and her parents were given the opportunity to  ask questions and their questions were answered. They were encouraged to reach out should additional questions arise. Two copies of her report was provided at the conclusion of the visit.      Less than 15 minutes were spent conducting the current feedback session with [name].

## 2020-10-29 ENCOUNTER — Encounter: Payer: Self-pay | Admitting: Neurology

## 2020-10-29 ENCOUNTER — Encounter: Payer: Self-pay | Admitting: Physician Assistant

## 2020-12-01 ENCOUNTER — Ambulatory Visit (INDEPENDENT_AMBULATORY_CARE_PROVIDER_SITE_OTHER): Payer: Medicare Other | Admitting: *Deleted

## 2020-12-01 ENCOUNTER — Other Ambulatory Visit: Payer: Self-pay

## 2020-12-01 DIAGNOSIS — Z3042 Encounter for surveillance of injectable contraceptive: Secondary | ICD-10-CM

## 2020-12-01 MED ORDER — MEDROXYPROGESTERONE ACETATE 150 MG/ML IM SUSP: 150.0000 mg | Freq: Once | INTRAMUSCULAR | Status: DC

## 2020-12-01 NOTE — Progress Notes (Signed)
Patient is here for Depo Provera Injection Patient is within Depo Provera Calender Limits yes, last given 09-04-20  Next Depo Due between: 11/15 to 11/29  Last AEX: 03-24-20 TW AEX Scheduled: 03-25-21  Patient is aware when next depo is due  Pt tolerated Injection well in right deltoid.  Routed to provider for review, encounter closed.

## 2020-12-31 DIAGNOSIS — Z993 Dependence on wheelchair: Secondary | ICD-10-CM | POA: Diagnosis not present

## 2020-12-31 DIAGNOSIS — R32 Unspecified urinary incontinence: Secondary | ICD-10-CM | POA: Diagnosis not present

## 2021-01-22 DIAGNOSIS — Z79899 Other long term (current) drug therapy: Secondary | ICD-10-CM | POA: Diagnosis not present

## 2021-01-22 DIAGNOSIS — E538 Deficiency of other specified B group vitamins: Secondary | ICD-10-CM | POA: Diagnosis not present

## 2021-01-22 DIAGNOSIS — E559 Vitamin D deficiency, unspecified: Secondary | ICD-10-CM | POA: Diagnosis not present

## 2021-01-22 DIAGNOSIS — R7309 Other abnormal glucose: Secondary | ICD-10-CM | POA: Diagnosis not present

## 2021-01-22 DIAGNOSIS — Z1211 Encounter for screening for malignant neoplasm of colon: Secondary | ICD-10-CM | POA: Diagnosis not present

## 2021-01-22 DIAGNOSIS — E7800 Pure hypercholesterolemia, unspecified: Secondary | ICD-10-CM | POA: Diagnosis not present

## 2021-01-22 DIAGNOSIS — G8250 Quadriplegia, unspecified: Secondary | ICD-10-CM | POA: Diagnosis not present

## 2021-01-22 DIAGNOSIS — Z0000 Encounter for general adult medical examination without abnormal findings: Secondary | ICD-10-CM | POA: Diagnosis not present

## 2021-01-22 DIAGNOSIS — Z23 Encounter for immunization: Secondary | ICD-10-CM | POA: Diagnosis not present

## 2021-01-26 ENCOUNTER — Ambulatory Visit: Payer: Medicare Other | Admitting: Neurology

## 2021-02-03 DIAGNOSIS — H2511 Age-related nuclear cataract, right eye: Secondary | ICD-10-CM | POA: Diagnosis not present

## 2021-02-03 DIAGNOSIS — Z961 Presence of intraocular lens: Secondary | ICD-10-CM | POA: Diagnosis not present

## 2021-02-03 DIAGNOSIS — H04123 Dry eye syndrome of bilateral lacrimal glands: Secondary | ICD-10-CM | POA: Diagnosis not present

## 2021-03-02 ENCOUNTER — Other Ambulatory Visit: Payer: Self-pay

## 2021-03-02 ENCOUNTER — Ambulatory Visit (INDEPENDENT_AMBULATORY_CARE_PROVIDER_SITE_OTHER): Payer: Medicare Other

## 2021-03-02 VITALS — BP 118/76 | HR 80

## 2021-03-02 DIAGNOSIS — Z3042 Encounter for surveillance of injectable contraceptive: Secondary | ICD-10-CM | POA: Diagnosis not present

## 2021-03-02 MED ORDER — MEDROXYPROGESTERONE ACETATE 150 MG/ML IM SUSP
150.0000 mg | Freq: Once | INTRAMUSCULAR | Status: AC
  Administered 2021-03-02: 150 mg via INTRAMUSCULAR

## 2021-03-02 NOTE — Progress Notes (Signed)
Patient is here for Depo Provera Injection Patient is within Depo Provera Calender Limits Yes Next Depo Due between: 05-18-21 to 06-01-21 Last AEX: 03-24-20 TW AEX Scheduled: 03-25-21  Patient is aware when next depo is due  Pt tolerated Injection well.  Routed to provider for review, encounter closed.

## 2021-03-25 ENCOUNTER — Other Ambulatory Visit: Payer: Medicare Other

## 2021-03-25 ENCOUNTER — Ambulatory Visit: Payer: Medicare Other | Admitting: Nurse Practitioner

## 2021-03-25 ENCOUNTER — Ambulatory Visit (INDEPENDENT_AMBULATORY_CARE_PROVIDER_SITE_OTHER): Payer: Medicare Other | Admitting: Nurse Practitioner

## 2021-03-25 ENCOUNTER — Other Ambulatory Visit: Payer: Self-pay

## 2021-03-25 ENCOUNTER — Ambulatory Visit (INDEPENDENT_AMBULATORY_CARE_PROVIDER_SITE_OTHER): Payer: Medicare Other

## 2021-03-25 ENCOUNTER — Other Ambulatory Visit: Payer: Self-pay | Admitting: Nurse Practitioner

## 2021-03-25 ENCOUNTER — Encounter: Payer: Self-pay | Admitting: Nurse Practitioner

## 2021-03-25 ENCOUNTER — Ambulatory Visit: Payer: Medicare Other

## 2021-03-25 VITALS — BP 124/82

## 2021-03-25 DIAGNOSIS — N6315 Unspecified lump in the right breast, overlapping quadrants: Secondary | ICD-10-CM

## 2021-03-25 DIAGNOSIS — Z01419 Encounter for gynecological examination (general) (routine) without abnormal findings: Secondary | ICD-10-CM

## 2021-03-25 DIAGNOSIS — N912 Amenorrhea, unspecified: Secondary | ICD-10-CM

## 2021-03-25 DIAGNOSIS — N920 Excessive and frequent menstruation with regular cycle: Secondary | ICD-10-CM

## 2021-03-25 MED ORDER — MEDROXYPROGESTERONE ACETATE 150 MG/ML IM SUSP
150.0000 mg | INTRAMUSCULAR | 3 refills | Status: DC
Start: 2021-03-25 — End: 2023-06-20

## 2021-03-25 NOTE — Progress Notes (Signed)
° °  Maureen Roth 06/10/1970 [id number]   History:  50 y.o. G0 presents for annual exam. Amenorrheic with Depo Provera. History of menorrhagia and dysmenorrhea with difficulty with self-care making menstrual cycles problematic.  FSH 28 one year ago. Annual ultrasound due to physical limitations with inability to perform pelvic exam. Normal mammogram history. Virginal. Spastic cerebral palsy. Mother present during visit today. Mother currently being treated for breast cancer.   Gynecologic History No LMP recorded. Patient has had an injection.   Contraception: abstinence and Depo-Provera injections Sexually active: No  Health maintenance Last Pap: unable to perform due to physical limitations Last mammogram: 01/15/2020. Results were: benign 8 mm grouped calcifications in left breast Last colonoscopy: Never Last Dexa: Not indicated  Past medical history, past surgical history, family history and social history were all reviewed and documented in the EPIC chart.  ROS:  A ROS was performed and pertinent positives and negatives are included.  Exam:  Vitals:   03/25/21 1439  BP: 124/82    There is no height or weight on file to calculate BMI.  General appearance:  Normal Thyroid:  Symmetrical, normal in size, without palpable masses or nodularity. Respiratory  Auscultation:  Clear without wheezing or rhonchi Cardiovascular  Auscultation:  Regular rate, without rubs, murmurs or gallops  Edema/varicosities:  Not grossly evident Abdominal : Unable to fully assess  Skin  Inspection:  Grossly normal Breasts: Examined lying and sitting.   Right: Without masses, retractions, nipple discharge or axillary adenopathy.   Left: Without masses, retractions, nipple discharge or axillary adenopathy. Genitourinary : Deferred. Pelvic ultrasound performed d/t physical limitations  Transabdominal Ultrasound:  Anteverted atrophic uterus, normal shape, no myometrial masses.  Thin, symmetrical  endometrium measuring 3.1 mm.  No masses or thickening seen.  Both ovaries are atrophic in appearance - WNL.  No adnexal masses, no free fluid.  Assessment/Plan:  50 y.o. G0 for annual exam.   Well female exam without gynecological exam - Education provided on SBEs, importance of preventative screenings, current guidelines, high calcium diet, regular exercise, and multivitamin daily. Labs with PCP. Discussed normality of ultrasound with patient and mother.    Amenorrhea - Plan: FSH. Unsure of menopausal status due to amenorrhea on Depo Provera. Ovaries atrophic on ultrasound.   Menorrhagia with regular cycle - Plan: medroxyPROGESTERone (DEPO-PROVERA) 150 MG/ML injection every 90 days. Menstrual suppression due to difficulty with self-care. Amenorrheic with Depo. Facility administers.   Breast lump on right side at 12 o'clock position - 5 mm mobile, non-tender mass about 2 cm from nipple. Will send for diagnostic mammogram.   Screening for cervical cancer - Has never had pap smear due to physical limitations of spastic cerebral palsy.  Follow up in 1 year for annual/ultrasound.       Olivia Mackie [facility], 4:03 PM 03/25/2021

## 2021-03-26 ENCOUNTER — Telehealth: Payer: Self-pay | Admitting: *Deleted

## 2021-03-26 DIAGNOSIS — N6315 Unspecified lump in the right breast, overlapping quadrants: Secondary | ICD-10-CM

## 2021-03-26 LAB — FOLLICLE STIMULATING HORMONE: FSH: 38.4 m[IU]/mL

## 2021-03-26 NOTE — Telephone Encounter (Signed)
Orders placed, The breast center is closed on 03/26/21. Will reach out to schedule next week.

## 2021-03-26 NOTE — Telephone Encounter (Signed)
-----   Message from Olivia Mackie, NP sent at 03/25/2021  4:28 PM EST ----- Regarding: Diagnostic mammogram Please send referral for diagnostic mammogram for right breast mass at 12 o'clock. She is also due for her screening mammogram.

## 2021-03-30 ENCOUNTER — Other Ambulatory Visit: Payer: Self-pay

## 2021-03-30 DIAGNOSIS — G801 Spastic diplegic cerebral palsy: Secondary | ICD-10-CM

## 2021-03-30 NOTE — Telephone Encounter (Signed)
Patient scheduled on 05/05/21 @ 10:45am at breast center. I called the preferred number and spoke with Gearldine Bienenstock the Group home supervisor and relayed all the above as well. She informed me she will let the mother know time and date as well.

## 2021-05-05 ENCOUNTER — Other Ambulatory Visit: Payer: Self-pay | Admitting: Nurse Practitioner

## 2021-05-05 ENCOUNTER — Ambulatory Visit
Admission: RE | Admit: 2021-05-05 | Discharge: 2021-05-05 | Disposition: A | Discharge status: Home / Self Care | Payer: Medicare Other | Source: Ambulatory Visit | Attending: Nurse Practitioner | Admitting: Nurse Practitioner

## 2021-05-05 DIAGNOSIS — R922 Inconclusive mammogram: Secondary | ICD-10-CM | POA: Diagnosis not present

## 2021-05-05 DIAGNOSIS — N6310 Unspecified lump in the right breast, unspecified quadrant: Secondary | ICD-10-CM

## 2021-05-05 DIAGNOSIS — N6315 Unspecified lump in the right breast, overlapping quadrants: Secondary | ICD-10-CM

## 2021-05-14 ENCOUNTER — Ambulatory Visit
Admission: RE | Admit: 2021-05-14 | Discharge: 2021-05-14 | Disposition: A | Discharge status: Home / Self Care | Payer: Medicare Other | Source: Ambulatory Visit | Attending: Nurse Practitioner | Admitting: Nurse Practitioner

## 2021-05-14 ENCOUNTER — Other Ambulatory Visit: Payer: Self-pay | Admitting: Radiology

## 2021-05-14 DIAGNOSIS — N6315 Unspecified lump in the right breast, overlapping quadrants: Secondary | ICD-10-CM | POA: Diagnosis not present

## 2021-05-14 DIAGNOSIS — N6310 Unspecified lump in the right breast, unspecified quadrant: Secondary | ICD-10-CM

## 2021-05-14 DIAGNOSIS — N6011 Diffuse cystic mastopathy of right breast: Secondary | ICD-10-CM | POA: Diagnosis not present

## 2021-05-14 HISTORY — PX: BREAST BIOPSY: SHX20

## 2021-07-16 ENCOUNTER — Ambulatory Visit (INDEPENDENT_AMBULATORY_CARE_PROVIDER_SITE_OTHER): Payer: Medicare Other

## 2021-07-16 DIAGNOSIS — N920 Excessive and frequent menstruation with regular cycle: Secondary | ICD-10-CM

## 2021-07-16 MED ORDER — MEDROXYPROGESTERONE ACETATE 150 MG/ML IM SUSP
150.0000 mg | Freq: Once | INTRAMUSCULAR | Status: AC
  Administered 2021-07-16: 150 mg via INTRAMUSCULAR

## 2021-07-23 DIAGNOSIS — M92211 Osteochondrosis (juvenile) of carpal lunate [Kienbock], right hand: Secondary | ICD-10-CM | POA: Diagnosis not present

## 2021-11-02 ENCOUNTER — Ambulatory Visit (INDEPENDENT_AMBULATORY_CARE_PROVIDER_SITE_OTHER): Payer: Medicare Other

## 2021-11-02 DIAGNOSIS — Z3042 Encounter for surveillance of injectable contraceptive: Secondary | ICD-10-CM

## 2021-11-02 MED ORDER — MEDROXYPROGESTERONE ACETATE 150 MG/ML IM SUSP
150.0000 mg | Freq: Once | INTRAMUSCULAR | Status: AC
  Administered 2021-11-02: 150 mg via INTRAMUSCULAR

## 2021-11-16 DIAGNOSIS — Z993 Dependence on wheelchair: Secondary | ICD-10-CM | POA: Diagnosis not present

## 2021-11-16 DIAGNOSIS — G809 Cerebral palsy, unspecified: Secondary | ICD-10-CM | POA: Diagnosis not present

## 2021-11-16 DIAGNOSIS — G8250 Quadriplegia, unspecified: Secondary | ICD-10-CM | POA: Diagnosis not present

## 2022-01-28 ENCOUNTER — Ambulatory Visit (INDEPENDENT_AMBULATORY_CARE_PROVIDER_SITE_OTHER): Payer: Medicare Other | Admitting: *Deleted

## 2022-01-28 DIAGNOSIS — Z3042 Encounter for surveillance of injectable contraceptive: Secondary | ICD-10-CM

## 2022-01-28 MED ORDER — MEDROXYPROGESTERONE ACETATE 150 MG/ML IM SUSP
150.0000 mg | Freq: Once | INTRAMUSCULAR | Status: AC
  Administered 2022-01-28: 150 mg via INTRAMUSCULAR

## 2022-02-08 DIAGNOSIS — Z961 Presence of intraocular lens: Secondary | ICD-10-CM | POA: Diagnosis not present

## 2022-02-08 DIAGNOSIS — H04123 Dry eye syndrome of bilateral lacrimal glands: Secondary | ICD-10-CM | POA: Diagnosis not present

## 2022-02-08 DIAGNOSIS — H2511 Age-related nuclear cataract, right eye: Secondary | ICD-10-CM | POA: Diagnosis not present

## 2022-02-09 DIAGNOSIS — Z1211 Encounter for screening for malignant neoplasm of colon: Secondary | ICD-10-CM | POA: Diagnosis not present

## 2022-02-09 DIAGNOSIS — E559 Vitamin D deficiency, unspecified: Secondary | ICD-10-CM | POA: Diagnosis not present

## 2022-02-09 DIAGNOSIS — E7800 Pure hypercholesterolemia, unspecified: Secondary | ICD-10-CM | POA: Diagnosis not present

## 2022-02-09 DIAGNOSIS — R7309 Other abnormal glucose: Secondary | ICD-10-CM | POA: Diagnosis not present

## 2022-02-09 DIAGNOSIS — G8250 Quadriplegia, unspecified: Secondary | ICD-10-CM | POA: Diagnosis not present

## 2022-02-09 DIAGNOSIS — Z0000 Encounter for general adult medical examination without abnormal findings: Secondary | ICD-10-CM | POA: Diagnosis not present

## 2022-02-09 DIAGNOSIS — Z79899 Other long term (current) drug therapy: Secondary | ICD-10-CM | POA: Diagnosis not present

## 2022-02-09 DIAGNOSIS — Z23 Encounter for immunization: Secondary | ICD-10-CM | POA: Diagnosis not present

## 2022-02-09 DIAGNOSIS — E538 Deficiency of other specified B group vitamins: Secondary | ICD-10-CM | POA: Diagnosis not present

## 2022-02-15 DIAGNOSIS — Z111 Encounter for screening for respiratory tuberculosis: Secondary | ICD-10-CM | POA: Diagnosis not present

## 2022-02-15 DIAGNOSIS — Z1211 Encounter for screening for malignant neoplasm of colon: Secondary | ICD-10-CM | POA: Diagnosis not present

## 2022-03-08 ENCOUNTER — Ambulatory Visit: Payer: Medicare Other | Admitting: Nurse Practitioner

## 2022-03-15 ENCOUNTER — Other Ambulatory Visit: Payer: Self-pay

## 2022-03-15 ENCOUNTER — Other Ambulatory Visit: Payer: Medicare Other | Admitting: Nurse Practitioner

## 2022-03-15 ENCOUNTER — Other Ambulatory Visit: Payer: Medicare Other

## 2022-03-15 DIAGNOSIS — N912 Amenorrhea, unspecified: Secondary | ICD-10-CM

## 2022-03-15 DIAGNOSIS — G801 Spastic diplegic cerebral palsy: Secondary | ICD-10-CM

## 2022-03-15 NOTE — Progress Notes (Deleted)
   Maureen Roth [birth year] [id number]   History:  51 y.o. G0 presents for annual exam. Amenorrheic with Depo Provera. History of menorrhagia and dysmenorrhea with difficulty with self-care making menstrual cycles problematic.  FSH 38 one year ago. Annual ultrasound due to physical limitations with inability to perform pelvic exam. Normal mammogram history. Virginal. Spastic cerebral palsy. Mother present during visit today. Mother currently being treated for breast cancer.   Gynecologic History No LMP recorded. Patient has had an injection.   Contraception: abstinence and Depo-Provera injections Sexually active: No  Health maintenance Last Pap: unable to perform due to physical limitations Last mammogram: 05/05/2021. Results were: Right breast mass, biopsy confirmed fibrocystic changes & features consistent with previous ruptured cyst Last colonoscopy: Never Last Dexa: Not indicated  Past medical history, past surgical history, family history and social history were all reviewed and documented in the EPIC chart.  ROS:  A ROS was performed and pertinent positives and negatives are included.  Exam:  There were no vitals filed for this visit.   There is no height or weight on file to calculate BMI.  General appearance:  Normal Thyroid:  Symmetrical, normal in size, without palpable masses or nodularity. Respiratory  Auscultation:  Clear without wheezing or rhonchi Cardiovascular  Auscultation:  Regular rate, without rubs, murmurs or gallops  Edema/varicosities:  Not grossly evident Abdominal : Unable to fully assess  Skin  Inspection:  Grossly normal Breasts: Examined lying and sitting.   Right: Without masses, retractions, nipple discharge or axillary adenopathy.   Left: Without masses, retractions, nipple discharge or axillary adenopathy. Genitourinary : Deferred. Pelvic ultrasound performed d/t physical limitations  Transabdominal Ultrasound:  Anteverted atrophic uterus,  normal shape, no myometrial masses.  Thin, symmetrical endometrium measuring 3.1 mm.  No masses or thickening seen.  Both ovaries are atrophic in appearance - WNL.  No adnexal masses, no free fluid.  Assessment/Plan:  51 y.o. G0 for annual exam.   Well female exam without gynecological exam - Education provided on SBEs, importance of preventative screenings, current guidelines, high calcium diet, regular exercise, and multivitamin daily. Labs with PCP. Discussed normality of ultrasound with patient and mother.    Amenorrhea - Plan: FSH. Unsure of menopausal status due to amenorrhea on Depo Provera. Ovaries atrophic on ultrasound.   Menorrhagia with regular cycle - Plan: medroxyPROGESTERone (DEPO-PROVERA) 150 MG/ML injection every 90 days. Menstrual suppression due to difficulty with self-care. Amenorrheic with Depo. Facility administers.   Screening for cervical cancer - Has never had pap smear due to physical limitations of spastic cerebral palsy.  Screening for cervical cancer - Normal Pap history.  Will repeat at 5-year interval per guidelines.  Screening for breast cancer - Benign biopsy done 05/2021. Continue annual screenings. Normal breast exam today.   Follow up in 1 year for annual/ultrasound.       Olivia Mackie [facility], 11:18 AM 03/15/2022

## 2022-04-12 ENCOUNTER — Ambulatory Visit (INDEPENDENT_AMBULATORY_CARE_PROVIDER_SITE_OTHER): Payer: Medicare Other

## 2022-04-12 ENCOUNTER — Encounter: Payer: Self-pay | Admitting: Nurse Practitioner

## 2022-04-12 ENCOUNTER — Ambulatory Visit (INDEPENDENT_AMBULATORY_CARE_PROVIDER_SITE_OTHER): Payer: Medicare Other | Admitting: Nurse Practitioner

## 2022-04-12 VITALS — BP 102/72 | HR 104

## 2022-04-12 DIAGNOSIS — Z01419 Encounter for gynecological examination (general) (routine) without abnormal findings: Secondary | ICD-10-CM | POA: Diagnosis not present

## 2022-04-12 DIAGNOSIS — N912 Amenorrhea, unspecified: Secondary | ICD-10-CM

## 2022-04-12 DIAGNOSIS — Z0000 Encounter for general adult medical examination without abnormal findings: Secondary | ICD-10-CM

## 2022-04-12 DIAGNOSIS — N920 Excessive and frequent menstruation with regular cycle: Secondary | ICD-10-CM

## 2022-04-12 DIAGNOSIS — G801 Spastic diplegic cerebral palsy: Secondary | ICD-10-CM

## 2022-04-12 NOTE — Progress Notes (Signed)
   Maureen Roth [birth year] [id number]   History:  52 y.o. G0 presents for annual exam. Amenorrheic with Depo Provera. History of menorrhagia and dysmenorrhea with difficulty with self-care making menstrual cycles problematic.  [place] 38 one year ago. Annual ultrasound due to physical limitations with inability to perform pelvic exam. Benign right breast biopsies 05/2021. Virginal. Spastic cerebral palsy. Mother present during visit today.   Gynecologic History No LMP recorded. Patient has had an injection.   Contraception: abstinence and Depo-Provera injections Sexually active: No  Health maintenance Last Pap: unable to perform due to physical limitations Last mammogram: 05/05/2021. Results were: Right breast mass, biopsy confirmed fibrocystic changes & features consistent with previous ruptured cyst Last colonoscopy: Never Last Dexa: Not indicated  Past medical history, past surgical history, family history and social history were all reviewed and documented in the EPIC chart. WC bound. Lives at facility. Mother treated for breast cancer last year.   ROS:  A ROS was performed and pertinent positives and negatives are included.  Exam:  Vitals:   04/12/22 1532  BP: 102/72  Pulse: (!) 104  SpO2: 97%     There is no height or weight on file to calculate BMI.  General appearance:  Normal Thyroid:  Symmetrical, normal in size, without palpable masses or nodularity. Respiratory  Auscultation:  Clear without wheezing or rhonchi Cardiovascular  Auscultation:  Regular rate, without rubs, murmurs or gallops  Edema/varicosities:  Not grossly evident Abdominal : Unable to fully assess  Skin  Inspection:  Grossly normal Breasts: Examined lying and sitting.   Right: Without masses, retractions, nipple discharge or axillary adenopathy.   Left: Without masses, retractions, nipple discharge or axillary adenopathy. Genitourinary : Deferred. Pelvic ultrasound performed d/t physical  limitations  Transabdominal Ultrasound:  Anteverted atrophic uterus, normal shape, no myometrial masses.  Thin, symmetrical endometrium measuring 3.34 mm.  No masses or thickening seen. Both ovaries are atrophic in appearance - WNL.  No adnexal masses, no free fluid.  Assessment/Plan:  52 y.o. G0 for annual exam.   Well female exam without gynecological exam - Education provided on SBEs, importance of preventative screenings, current guidelines, high calcium diet, regular exercise, and multivitamin daily. Labs with PCP. Discussed normality of ultrasound with patient and mother.    Amenorrhea - Plan: FSH. Unsure of menopausal status due to amenorrhea on Depo Provera. [place] 38 one year ago. Ovaries atrophic on ultrasound.   Menorrhagia with regular cycle - Menstrual suppression due to difficulty with self-care. Amenorrheic with Depo. Will determine if she will continue after [facility] level.   Screening for cervical cancer - Has never had pap smear due to physical limitations of spastic cerebral palsy.  Screening for breast cancer - Benign biopsy done 05/2021. Continue annual screenings. Normal breast exam today.   Follow up in 1 year for annual/ultrasound.       Tamela Gammon [facility], 3:45 PM 04/12/2022

## 2022-04-18 ENCOUNTER — Telehealth: Payer: Self-pay

## 2022-04-18 NOTE — Telephone Encounter (Signed)
I spoke with patient's mom and she said that they were told there were no lab orders for the patient so they left the office.  She told me to call Maureen Roth at the [facility] [phone] to schedule a lab appointment.  I called her but she was off today and did not have access to home calendar. She will call me tomorrow when she is back at work.

## 2022-04-18 NOTE — Telephone Encounter (Signed)
Maureen Gammon, NP  [facility] Triage Patient and mother did not stop at lab after their visit last week. Please let them know they can return for this is they would like. Thanks.

## 2022-04-21 ENCOUNTER — Other Ambulatory Visit: Payer: Self-pay | Admitting: Family Medicine

## 2022-04-21 ENCOUNTER — Telehealth: Payer: Self-pay | Admitting: *Deleted

## 2022-04-21 DIAGNOSIS — Z1231 Encounter for screening mammogram for malignant neoplasm of breast: Secondary | ICD-10-CM

## 2022-04-21 NOTE — Telephone Encounter (Signed)
Call to patient's mother, Tessie Fass, okay to speak with per DPR. Advised Patsy, someone from group home reaching out in regards to patient's depo schedule. Patsy advised, okay to speak with Velna Hatchet, Director at the [employer] resides at.   Attempted to reach [place], but received personal caregiver, unable to speak with as she was not listed on DPR.   2:15 pm  Detailed message left for patient's mother, Tessie Fass, advising visit on 04-26-22 changed to nurse visit and patient could have labs done that day as well per Sharee Pimple, Therapist, art. Asked mother if at all possible, if any guardianship papers present with Group Home, to have them bring that to appointment so it could be scanned into her chart.   Encounter closed.

## 2022-04-26 ENCOUNTER — Ambulatory Visit (INDEPENDENT_AMBULATORY_CARE_PROVIDER_SITE_OTHER): Payer: Medicare Other | Admitting: *Deleted

## 2022-04-26 DIAGNOSIS — N912 Amenorrhea, unspecified: Secondary | ICD-10-CM | POA: Diagnosis not present

## 2022-04-26 DIAGNOSIS — Z3042 Encounter for surveillance of injectable contraceptive: Secondary | ICD-10-CM

## 2022-04-26 MED ORDER — MEDROXYPROGESTERONE ACETATE 150 MG/ML IM SUSP
150.0000 mg | Freq: Once | INTRAMUSCULAR | Status: AC
  Administered 2022-04-26: 150 mg via INTRAMUSCULAR

## 2022-04-26 NOTE — Telephone Encounter (Signed)
See 04/21/22 telephone enc.

## 2022-04-27 LAB — FOLLICLE STIMULATING HORMONE: FSH: 72 m[IU]/mL

## 2022-06-07 IMAGING — MG DIGITAL DIAGNOSTIC BILAT W/ TOMO W/ CAD
8 of 12 series · 8 of 28 positions shown · non-contrast
Comparison: Previous exam(s).

CLINICAL DATA: One year interval follow-up of calcifications
involving the OUTER LEFT breast at MIDDLE depth. Annual evaluation,
RIGHT breast.

EXAM:
DIGITAL DIAGNOSTIC BILATERAL MAMMOGRAM WITH TOMO AND CAD

[L MLO (1 of 3)]
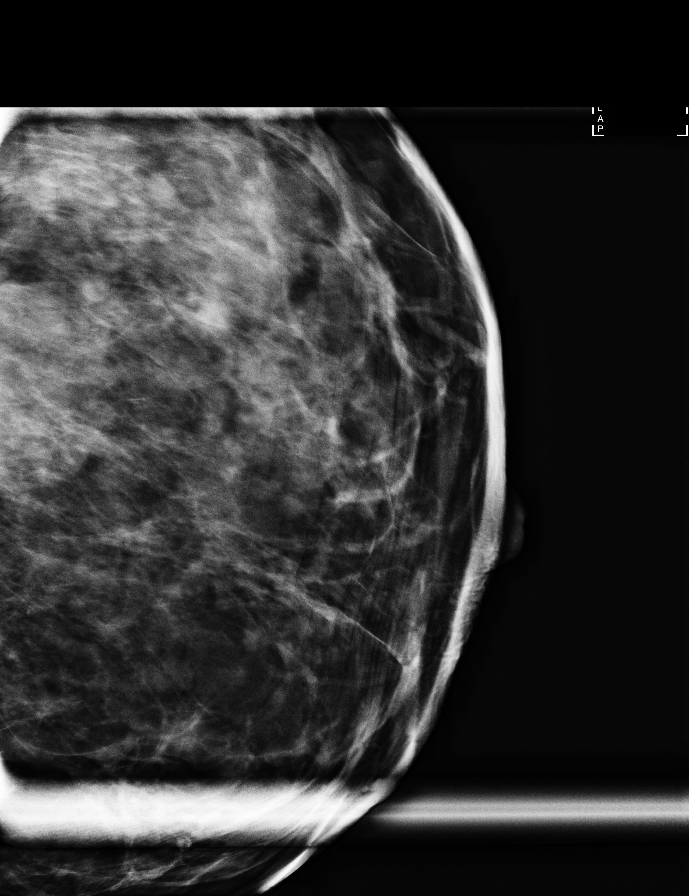

[L CC]
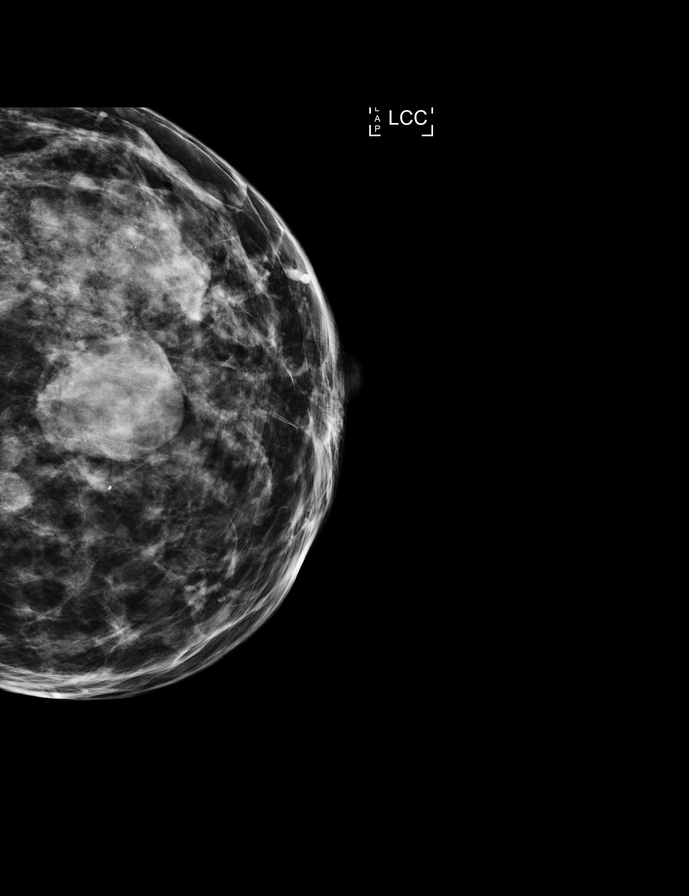

[L MLO (2 of 3)]
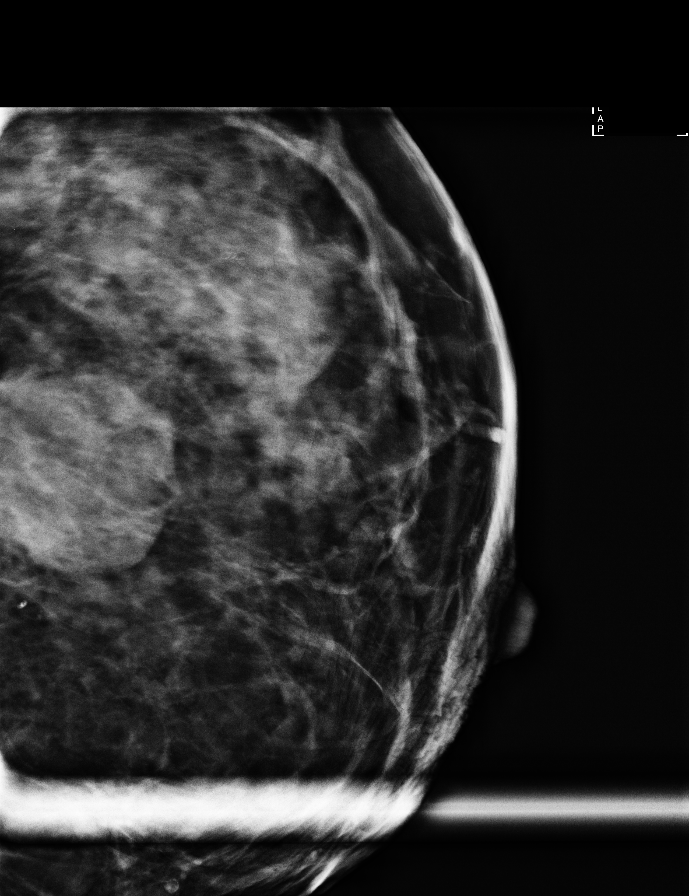

[L MLO (3 of 3)]
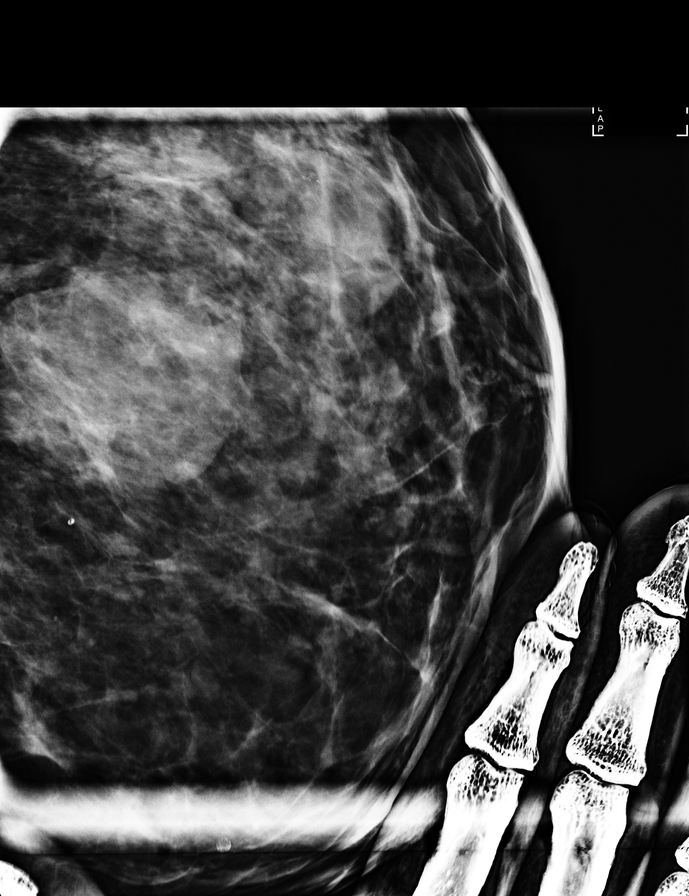

[L MLO synth-2D]
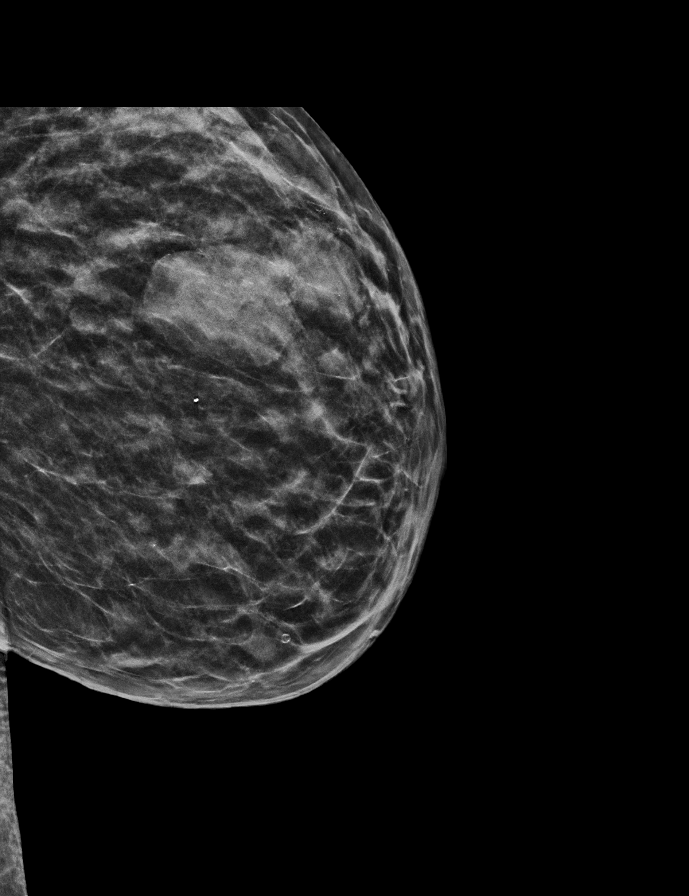

[R MLO synth-2D]
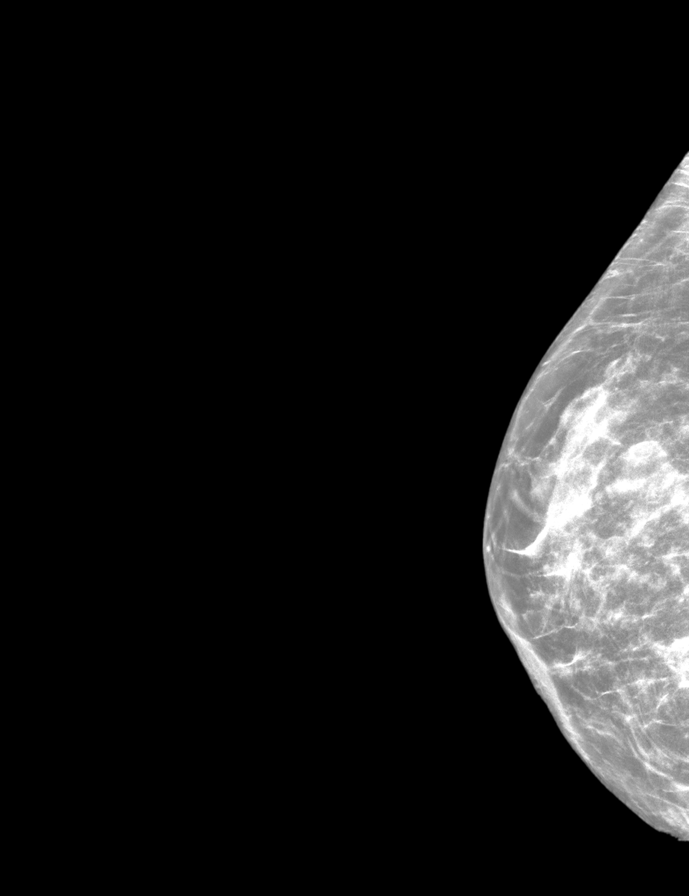

[R CC synth-2D]
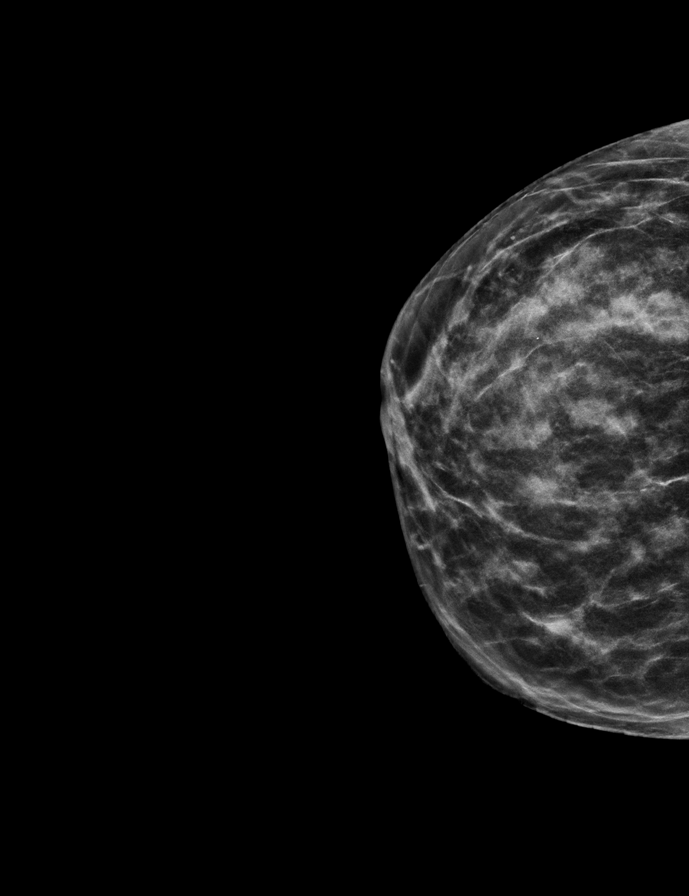

[L CC synth-2D]
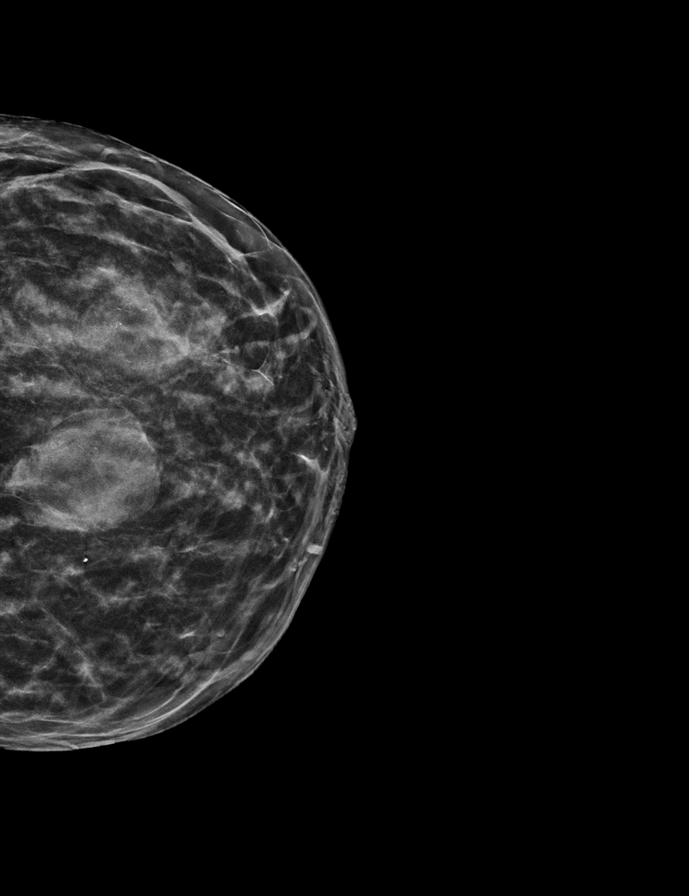

[8 of 28 positions shown; findings below may reference images not displayed]

ACR Breast Density Category c: The breast tissue is heterogeneously
dense, which may obscure small masses.
FINDINGS: Tomosynthesis and synthesized full field CC and MLO views of both
breasts were obtained. Attempts were made at spot magnification CC
and MLO views of the LEFT breast calcifications, though these were
technically suboptimal due to the patient's physical limitations.

The 8 mm group of faint punctate calcifications in the OUTER LEFT
breast at MIDDLE depth, best seen on the full field CC images, are
unchanged dating back to the January 2019 mammogram. There are no
new suspicious linear or branching forms.

Circumscribed isodense masses in both breasts are again
demonstrated, waxing and waning in size, a benign finding.

No new or suspicious findings in either breast.

Mammographic images were processed with CAD.
IMPRESSION: 1. Technically limited examination due to the patient's physical
limitations demonstrates no significant change in the likely benign
8 mm group calcifications in the OUTER LEFT breast at MIDDLE depth.
2. No new or suspicious findings in either breast.

RECOMMENDATION:
Diagnostic BILATERAL mammography in 1 year.

I have discussed the findings and recommendations with the patient.
If applicable, a reminder letter will be sent to the patient
regarding the next appointment.

BI-RADS CATEGORY  3: Probably benign.

## 2022-06-10 ENCOUNTER — Ambulatory Visit
Admission: RE | Admit: 2022-06-10 | Discharge: 2022-06-10 | Disposition: A | Discharge status: Home / Self Care | Payer: Medicare Other | Source: Ambulatory Visit | Attending: Family Medicine | Admitting: Family Medicine

## 2022-06-10 DIAGNOSIS — Z1231 Encounter for screening mammogram for malignant neoplasm of breast: Secondary | ICD-10-CM

## 2022-07-26 DIAGNOSIS — M92211 Osteochondrosis (juvenile) of carpal lunate [Kienbock], right hand: Secondary | ICD-10-CM | POA: Diagnosis not present

## 2022-09-30 DIAGNOSIS — H1033 Unspecified acute conjunctivitis, bilateral: Secondary | ICD-10-CM | POA: Diagnosis not present

## 2023-01-25 DIAGNOSIS — Z993 Dependence on wheelchair: Secondary | ICD-10-CM | POA: Diagnosis not present

## 2023-01-25 DIAGNOSIS — G8250 Quadriplegia, unspecified: Secondary | ICD-10-CM | POA: Diagnosis not present

## 2023-01-25 DIAGNOSIS — S41119A Laceration without foreign body of unspecified upper arm, initial encounter: Secondary | ICD-10-CM | POA: Diagnosis not present

## 2023-01-25 DIAGNOSIS — G809 Cerebral palsy, unspecified: Secondary | ICD-10-CM | POA: Diagnosis not present

## 2023-01-25 DIAGNOSIS — F79 Unspecified intellectual disabilities: Secondary | ICD-10-CM | POA: Diagnosis not present

## 2023-02-15 DIAGNOSIS — H04123 Dry eye syndrome of bilateral lacrimal glands: Secondary | ICD-10-CM | POA: Diagnosis not present

## 2023-02-15 DIAGNOSIS — Z961 Presence of intraocular lens: Secondary | ICD-10-CM | POA: Diagnosis not present

## 2023-02-15 DIAGNOSIS — H2511 Age-related nuclear cataract, right eye: Secondary | ICD-10-CM | POA: Diagnosis not present

## 2023-02-24 DIAGNOSIS — Z0000 Encounter for general adult medical examination without abnormal findings: Secondary | ICD-10-CM | POA: Diagnosis not present

## 2023-02-24 DIAGNOSIS — E7800 Pure hypercholesterolemia, unspecified: Secondary | ICD-10-CM | POA: Diagnosis not present

## 2023-02-24 DIAGNOSIS — E538 Deficiency of other specified B group vitamins: Secondary | ICD-10-CM | POA: Diagnosis not present

## 2023-02-24 DIAGNOSIS — Z79899 Other long term (current) drug therapy: Secondary | ICD-10-CM | POA: Diagnosis not present

## 2023-02-24 DIAGNOSIS — E559 Vitamin D deficiency, unspecified: Secondary | ICD-10-CM | POA: Diagnosis not present

## 2023-02-24 DIAGNOSIS — Z23 Encounter for immunization: Secondary | ICD-10-CM | POA: Diagnosis not present

## 2023-02-24 DIAGNOSIS — Z1211 Encounter for screening for malignant neoplasm of colon: Secondary | ICD-10-CM | POA: Diagnosis not present

## 2023-02-24 DIAGNOSIS — R7309 Other abnormal glucose: Secondary | ICD-10-CM | POA: Diagnosis not present

## 2023-02-24 DIAGNOSIS — G8250 Quadriplegia, unspecified: Secondary | ICD-10-CM | POA: Diagnosis not present

## 2023-03-01 DIAGNOSIS — Z1211 Encounter for screening for malignant neoplasm of colon: Secondary | ICD-10-CM | POA: Diagnosis not present

## 2023-03-01 DIAGNOSIS — H6123 Impacted cerumen, bilateral: Secondary | ICD-10-CM | POA: Diagnosis not present

## 2023-05-03 ENCOUNTER — Other Ambulatory Visit: Payer: Self-pay | Admitting: Family Medicine

## 2023-05-03 DIAGNOSIS — Z1231 Encounter for screening mammogram for malignant neoplasm of breast: Secondary | ICD-10-CM

## 2023-05-26 DIAGNOSIS — F79 Unspecified intellectual disabilities: Secondary | ICD-10-CM | POA: Diagnosis not present

## 2023-05-26 DIAGNOSIS — T300 Burn of unspecified body region, unspecified degree: Secondary | ICD-10-CM | POA: Diagnosis not present

## 2023-05-26 DIAGNOSIS — Z993 Dependence on wheelchair: Secondary | ICD-10-CM | POA: Diagnosis not present

## 2023-05-26 DIAGNOSIS — S93401A Sprain of unspecified ligament of right ankle, initial encounter: Secondary | ICD-10-CM | POA: Diagnosis not present

## 2023-06-12 ENCOUNTER — Ambulatory Visit: Payer: Medicare Other

## 2023-06-13 ENCOUNTER — Ambulatory Visit: Payer: Medicare Other

## 2023-06-16 ENCOUNTER — Other Ambulatory Visit

## 2023-06-20 ENCOUNTER — Encounter: Payer: Self-pay | Admitting: Nurse Practitioner

## 2023-06-20 ENCOUNTER — Ambulatory Visit (INDEPENDENT_AMBULATORY_CARE_PROVIDER_SITE_OTHER): Admitting: Nurse Practitioner

## 2023-06-20 VITALS — BP 112/82 | HR 92

## 2023-06-20 DIAGNOSIS — N3941 Urge incontinence: Secondary | ICD-10-CM | POA: Diagnosis not present

## 2023-06-20 DIAGNOSIS — N3281 Overactive bladder: Secondary | ICD-10-CM

## 2023-06-20 DIAGNOSIS — N951 Menopausal and female climacteric states: Secondary | ICD-10-CM

## 2023-06-20 NOTE — Progress Notes (Signed)
   Acute Office Visit  Subjective:    Patient ID: Maureen Roth, female    DOB: 1970/11/01, 53 y.o.   MRN: [id number]   HPI 53 y.o. presents today to discuss possible menopausal symptoms. Mother requested appointment to discuss patient's mood changes and urinary and fecal incontinence. She is wondering if patient is menopausal and if this could be causing her symptoms. FSH 72 January 2024 with recommendations to stop Depo use due to menopausal status. No bleeding. Patient has not personally noticed mood changes but says family/staff has. Has some occasional night sweats. Urinary and fecal incontinence is somewhat new. When she gets the urge to go sometimes she cannot hold it while waiting for caregivers to arrive. Patient has spastic cerebral palsy, resides in facility. Caregiver present.   No LMP recorded. Patient is postmenopausal.    Review of Systems  Constitutional: Negative.   Endocrine: Positive for heat intolerance.  Genitourinary:        Urinary incontinence  Psychiatric/Behavioral:  Positive for agitation (Mild per patient). Negative for dysphoric mood and sleep disturbance. The patient is not nervous/anxious.        Objective:    Physical Exam Constitutional:      Appearance: Normal appearance.  Neurological:     Mental Status: She is alert.  Psychiatric:        Attention and Perception: Attention normal.        Mood and Affect: Mood normal.        Speech: Speech normal.        Behavior: Behavior normal.     BP 112/82   Pulse 92   SpO2 97%  Wt Readings from Last 3 Encounters:  11/23/12 130 lb (59 kg)         Assessment & Plan:   Problem List Items Addressed This Visit   None Visit Diagnoses       Overactive bladder    -  Primary     Urge incontinence         Menopausal state          Plan: Reviewed menopausal status. Labs not indicated as we already know she is menopausal. Discussed possible menopausal symptoms and likely not the cause for urinary  and fecal incontinence. Patient reports mild night sweats, does not really feel her moods are bad. Option for low dose SSRI. Discussed OAB/urge incontinence and management options. Would not be able to participate in pelvic floor PT due to immobility. Option for Oxybutynin and urology referral discussed. Caregiver/facility will discuss recommendations with mother.   Return if symptoms worsen or fail to improve.    Olivia Mackie DNP, 2:27 PM 06/20/2023

## 2023-06-20 NOTE — Patient Instructions (Addendum)
 Labs completed 04/26/2022 and verified patient is menopausal. Repeat labs are not indicated. Discussed overactive bladder/urge incontinence and option to try Oxybutynin for management, as well as a urology referral if preferred.

## 2023-06-23 ENCOUNTER — Ambulatory Visit

## 2023-06-27 ENCOUNTER — Ambulatory Visit
Admission: RE | Admit: 2023-06-27 | Discharge: 2023-06-27 | Disposition: A | Discharge status: Home / Self Care | Source: Ambulatory Visit | Attending: Family Medicine | Admitting: Family Medicine

## 2023-06-27 DIAGNOSIS — Z1231 Encounter for screening mammogram for malignant neoplasm of breast: Secondary | ICD-10-CM | POA: Diagnosis not present

## 2023-06-30 ENCOUNTER — Other Ambulatory Visit: Payer: Self-pay | Admitting: Family Medicine

## 2023-06-30 DIAGNOSIS — R928 Other abnormal and inconclusive findings on diagnostic imaging of breast: Secondary | ICD-10-CM

## 2023-07-21 ENCOUNTER — Ambulatory Visit
Admission: RE | Admit: 2023-07-21 | Discharge: 2023-07-21 | Disposition: A | Discharge status: Home / Self Care | Source: Ambulatory Visit | Attending: Family Medicine | Admitting: Family Medicine

## 2023-07-21 DIAGNOSIS — R928 Other abnormal and inconclusive findings on diagnostic imaging of breast: Secondary | ICD-10-CM

## 2023-07-21 DIAGNOSIS — N6315 Unspecified lump in the right breast, overlapping quadrants: Secondary | ICD-10-CM | POA: Diagnosis not present

## 2023-07-21 DIAGNOSIS — N6311 Unspecified lump in the right breast, upper outer quadrant: Secondary | ICD-10-CM | POA: Diagnosis not present

## 2023-07-21 DIAGNOSIS — N6002 Solitary cyst of left breast: Secondary | ICD-10-CM | POA: Diagnosis not present

## 2023-07-21 DIAGNOSIS — N6001 Solitary cyst of right breast: Secondary | ICD-10-CM | POA: Diagnosis not present

## 2023-07-21 DIAGNOSIS — N6325 Unspecified lump in the left breast, overlapping quadrants: Secondary | ICD-10-CM | POA: Diagnosis not present

## 2023-10-02 DIAGNOSIS — R400 Somnolence: Secondary | ICD-10-CM | POA: Diagnosis not present

## 2023-11-14 DIAGNOSIS — R0683 Snoring: Secondary | ICD-10-CM | POA: Diagnosis not present

## 2023-12-20 DIAGNOSIS — R0683 Snoring: Secondary | ICD-10-CM | POA: Diagnosis not present

## 2024-01-30 DIAGNOSIS — Z23 Encounter for immunization: Secondary | ICD-10-CM | POA: Diagnosis not present

## 2024-01-30 DIAGNOSIS — G809 Cerebral palsy, unspecified: Secondary | ICD-10-CM | POA: Diagnosis not present

## 2024-01-30 DIAGNOSIS — E559 Vitamin D deficiency, unspecified: Secondary | ICD-10-CM | POA: Diagnosis not present

## 2024-01-30 DIAGNOSIS — E538 Deficiency of other specified B group vitamins: Secondary | ICD-10-CM | POA: Diagnosis not present

## 2024-01-30 DIAGNOSIS — M25511 Pain in right shoulder: Secondary | ICD-10-CM | POA: Diagnosis not present

## 2024-01-30 DIAGNOSIS — R350 Frequency of micturition: Secondary | ICD-10-CM | POA: Diagnosis not present

## 2024-02-07 DIAGNOSIS — G4733 Obstructive sleep apnea (adult) (pediatric): Secondary | ICD-10-CM | POA: Diagnosis not present

## 2024-03-11 DIAGNOSIS — Z0000 Encounter for general adult medical examination without abnormal findings: Secondary | ICD-10-CM | POA: Diagnosis not present

## 2024-03-11 DIAGNOSIS — Z1211 Encounter for screening for malignant neoplasm of colon: Secondary | ICD-10-CM | POA: Diagnosis not present

## 2024-03-11 DIAGNOSIS — Z79899 Other long term (current) drug therapy: Secondary | ICD-10-CM | POA: Diagnosis not present

## 2024-03-11 DIAGNOSIS — R7309 Other abnormal glucose: Secondary | ICD-10-CM | POA: Diagnosis not present

## 2024-03-11 DIAGNOSIS — R32 Unspecified urinary incontinence: Secondary | ICD-10-CM | POA: Diagnosis not present

## 2024-03-11 DIAGNOSIS — E7800 Pure hypercholesterolemia, unspecified: Secondary | ICD-10-CM | POA: Diagnosis not present

## 2024-03-11 DIAGNOSIS — E538 Deficiency of other specified B group vitamins: Secondary | ICD-10-CM | POA: Diagnosis not present

## 2024-03-11 DIAGNOSIS — E559 Vitamin D deficiency, unspecified: Secondary | ICD-10-CM | POA: Diagnosis not present

## 2024-03-11 DIAGNOSIS — G8250 Quadriplegia, unspecified: Secondary | ICD-10-CM | POA: Diagnosis not present
